# Patient Record
Sex: Male | Born: 1960 | Race: White | Hispanic: No | Marital: Married | Smoking: Former smoker
Health system: Southern US, Community
[De-identification: ages and names within clinical notes are randomized; demographics above are authoritative.]

## PROBLEM LIST (undated history)

## (undated) DIAGNOSIS — J189 Pneumonia, unspecified organism: Secondary | ICD-10-CM

## (undated) DIAGNOSIS — I10 Essential (primary) hypertension: Secondary | ICD-10-CM

## (undated) DIAGNOSIS — R55 Syncope and collapse: Secondary | ICD-10-CM

## (undated) DIAGNOSIS — S82112A Displaced fracture of left tibial spine, initial encounter for closed fracture: Secondary | ICD-10-CM

---

## 2009-05-14 ENCOUNTER — Emergency Department (HOSPITAL_COMMUNITY): Admission: EM | Admit: 2009-05-14 | Discharge: 2009-05-14 | Payer: Self-pay | Admitting: Emergency Medicine

## 2010-07-20 LAB — URINALYSIS, ROUTINE W REFLEX MICROSCOPIC
Bilirubin Urine: NEGATIVE
Hgb urine dipstick: NEGATIVE
Nitrite: NEGATIVE
Protein, ur: NEGATIVE mg/dL
Urobilinogen, UA: 0.2 mg/dL (ref 0.0–1.0)
pH: 6 (ref 5.0–8.0)

## 2010-07-20 LAB — CBC
HCT: 50.3 % (ref 39.0–52.0)
Hemoglobin: 17.4 g/dL — ABNORMAL HIGH (ref 13.0–17.0)
Platelets: 236 10*3/uL (ref 150–400)
RBC: 6.01 MIL/uL — ABNORMAL HIGH (ref 4.22–5.81)
RDW: 13.5 % (ref 11.5–15.5)

## 2010-07-20 LAB — BASIC METABOLIC PANEL
Chloride: 104 mEq/L (ref 96–112)
GFR calc non Af Amer: >60 mL/min (ref >60)
Sodium: 140 mEq/L (ref 135–145)

## 2010-07-20 LAB — DIFFERENTIAL
Basophils Absolute: 0.1 10*3/uL (ref 0.0–0.1)
Eosinophils Absolute: 0.1 10*3/uL (ref 0.0–0.7)
Lymphocytes Relative: 36 % (ref 12–46)
Lymphs Abs: 4.2 10*3/uL — ABNORMAL HIGH (ref 0.7–4.0)

## 2012-02-03 ENCOUNTER — Inpatient Hospital Stay (HOSPITAL_COMMUNITY): Payer: Self-pay

## 2012-02-03 ENCOUNTER — Encounter (HOSPITAL_COMMUNITY): Payer: Self-pay | Admitting: Emergency Medicine

## 2012-02-03 ENCOUNTER — Inpatient Hospital Stay (HOSPITAL_COMMUNITY)
Admission: EM | Admit: 2012-02-03 | Discharge: 2012-02-05 | DRG: 641 | Disposition: A | Discharge status: Home / Self Care | Payer: MEDICAID | Attending: Internal Medicine | Admitting: Internal Medicine

## 2012-02-03 DIAGNOSIS — I10 Essential (primary) hypertension: Secondary | ICD-10-CM | POA: Diagnosis present

## 2012-02-03 DIAGNOSIS — E860 Dehydration: Principal | ICD-10-CM | POA: Diagnosis present

## 2012-02-03 DIAGNOSIS — Y92009 Unspecified place in unspecified non-institutional (private) residence as the place of occurrence of the external cause: Secondary | ICD-10-CM

## 2012-02-03 DIAGNOSIS — R55 Syncope and collapse: Secondary | ICD-10-CM

## 2012-02-03 DIAGNOSIS — Z87891 Personal history of nicotine dependence: Secondary | ICD-10-CM

## 2012-02-03 DIAGNOSIS — N179 Acute kidney failure, unspecified: Secondary | ICD-10-CM | POA: Diagnosis present

## 2012-02-03 DIAGNOSIS — Z79899 Other long term (current) drug therapy: Secondary | ICD-10-CM

## 2012-02-03 DIAGNOSIS — N058 Unspecified nephritic syndrome with other morphologic changes: Secondary | ICD-10-CM | POA: Diagnosis present

## 2012-02-03 DIAGNOSIS — N289 Disorder of kidney and ureter, unspecified: Secondary | ICD-10-CM

## 2012-02-03 DIAGNOSIS — T394X5A Adverse effect of antirheumatics, not elsewhere classified, initial encounter: Secondary | ICD-10-CM | POA: Diagnosis present

## 2012-02-03 HISTORY — DX: Essential (primary) hypertension: I10

## 2012-02-03 HISTORY — DX: Syncope and collapse: R55

## 2012-02-03 LAB — RAPID URINE DRUG SCREEN, HOSP PERFORMED
Cocaine: NOT DETECTED
Opiates: NOT DETECTED

## 2012-02-03 LAB — CBC WITH DIFFERENTIAL/PLATELET
Basophils Absolute: 0 10*3/uL (ref 0.0–0.1)
Basophils Relative: 0 % (ref 0–1)
Lymphocytes Relative: 18 % (ref 12–46)
Lymphs Abs: 1.4 10*3/uL (ref 0.7–4.0)
MCV: 86.1 fL (ref 78.0–100.0)
Monocytes Absolute: 0.7 10*3/uL (ref 0.1–1.0)
Monocytes Relative: 9 % (ref 3–12)
Neutrophils Relative %: 72 % (ref 43–77)
WBC: 8.2 10*3/uL (ref 4.0–10.5)

## 2012-02-03 LAB — URINALYSIS, ROUTINE W REFLEX MICROSCOPIC
Bilirubin Urine: NEGATIVE
Ketones, ur: NEGATIVE mg/dL
Protein, ur: NEGATIVE mg/dL
Specific Gravity, Urine: 1.013 (ref 1.005–1.030)
pH: 5.5 (ref 5.0–8.0)

## 2012-02-03 LAB — URINE MICROSCOPIC-ADD ON

## 2012-02-03 LAB — BASIC METABOLIC PANEL: Sodium: 135 mEq/L (ref 135–145)

## 2012-02-03 LAB — CK: Total CK: 467 U/L — ABNORMAL HIGH (ref 7–232)

## 2012-02-03 MED ORDER — ACETAMINOPHEN 325 MG PO TABS: 650.0000 mg | ORAL_TABLET | Freq: Four times a day (QID) | ORAL | Status: DC | PRN

## 2012-02-03 MED ORDER — ASPIRIN EC 81 MG PO TBEC
81.0000 mg | DELAYED_RELEASE_TABLET | Freq: Every day | ORAL | Status: DC
  Administered 2012-02-03 – 2012-02-05 (×3): 81 mg via ORAL
  Filled 2012-02-03 (×3): qty 1

## 2012-02-03 MED ORDER — ACETAMINOPHEN 650 MG RE SUPP: 650.0000 mg | Freq: Four times a day (QID) | RECTAL | Status: DC | PRN

## 2012-02-03 MED ORDER — ENOXAPARIN SODIUM 30 MG/0.3ML ~~LOC~~ SOLN
30.0000 mg | SUBCUTANEOUS | Status: DC
  Administered 2012-02-03: 30 mg via SUBCUTANEOUS
  Filled 2012-02-03 (×2): qty 0.3

## 2012-02-03 MED ORDER — SIMVASTATIN 20 MG PO TABS
20.0000 mg | ORAL_TABLET | Freq: Every day | ORAL | Status: DC
  Administered 2012-02-04: 20 mg via ORAL
  Filled 2012-02-03 (×2): qty 1

## 2012-02-03 MED ORDER — ONDANSETRON HCL 4 MG/2ML IJ SOLN: 4.0000 mg | Freq: Four times a day (QID) | INTRAMUSCULAR | Status: DC | PRN

## 2012-02-03 MED ORDER — SODIUM CHLORIDE 0.9 % IV SOLN
INTRAVENOUS | Status: DC
  Administered 2012-02-03: 22:00:00 via INTRAVENOUS
  Administered 2012-02-04 (×2): 100 mL/h via INTRAVENOUS
  Administered 2012-02-05: 04:00:00 via INTRAVENOUS

## 2012-02-03 MED ORDER — ONDANSETRON HCL 4 MG PO TABS: 4.0000 mg | ORAL_TABLET | Freq: Four times a day (QID) | ORAL | Status: DC | PRN

## 2012-02-03 MED ORDER — SODIUM CHLORIDE 0.9 % IJ SOLN: 3.0000 mL | Freq: Two times a day (BID) | INTRAMUSCULAR | Status: DC

## 2012-02-03 NOTE — ED Provider Notes (Signed)
Medical screening examination/treatment/procedure(s) were performed by non-physician practitioner and as supervising physician I was immediately available for consultation/collaboration.   Gwyneth Sprout, MD 02/03/12 2320

## 2012-02-03 NOTE — ED Notes (Signed)
Per EMS: Pt was outside working for majority of day, had a syncopal episode at [employer]. Pt diaphoretic, N/V. Pt reports taking lisinopril. Pt denies allergies.

## 2012-02-03 NOTE — ED Provider Notes (Signed)
History     CSN: [id number]  Arrival date & time 02/03/12  1655   First MD Initiated Contact with Patient 02/03/12 1702      Chief Complaint  Patient presents with  . Loss of Consciousness    (Consider location/radiation/quality/duration/timing/severity/associated sxs/prior treatment) HPI Emergency department after an episode of syncope. He has been out in the hot sun all day but says he drank [place] water. He denies having any headache or chest pain, weakness, dizziness or any symptoms preceding the syncopal episode. He admits that he has had an episode of this before her but they did not say it was anything seriously give the emergency department to be evaluated for it. His mother and sister are in the room with him and they say that the family has a significant family history for renal failure or requiring transplants. The patient at this time is in no acute distress his vital signs are stable, including orthostatic vital signs.   Past Medical History  Diagnosis Date  . Syncope     History reviewed. No pertinent past surgical history.  No family history on file.  History  Substance Use Topics  . Smoking status: Former Games developer  . Smokeless tobacco: Never Used  . Alcohol Use: No      Review of Systems  Review of Systems  Gen: no weight loss, fevers, chills, night sweats  Eyes: no discharge or drainage, no occular pain or visual changes  Nose: no epistaxis or rhinorrhea  Mouth: no dental pain, no sore throat  Neck: no neck pain  Lungs:No wheezing, coughing or hemoptysis CV: no chest pain, palpitations, dependent edema or orthopnea  Abd: no abdominal pain, nausea, vomiting  GU: no dysuria or gross hematuria  MSK:  No abnormalities  Neuro: no headache, no focal neurologic deficits, + syncopal episode Skin: no abnormalities Psyche: negative.   Allergies  Review of patient's allergies indicates no known allergies.  Home Medications   Current Outpatient Rx    Name Route Sig Dispense Refill  . LISINOPRIL 10 MG PO TABS Oral Take 10 mg by mouth daily.    Marland Kitchen PRAVASTATIN SODIUM 40 MG PO TABS Oral Take 40 mg by mouth at bedtime.    Marland Kitchen RANITIDINE HCL 150 MG PO TABS Oral Take 150 mg by mouth 2 (two) times daily.      BP 104/70  Pulse 79  Temp 97.5 F (36.4 C) (Oral)  Resp 18  Ht 5\' 9"  (1.753 m)  Wt 180 lb (81.647 kg)  BMI 26.58 kg/m2  SpO2 100%  Physical Exam  Nursing note and vitals reviewed. Constitutional: He appears well-developed and well-nourished. No distress.  HENT:  Head: Normocephalic and atraumatic.  Eyes: Pupils are equal, round, and reactive to light.  Neck: Normal range of motion. Neck supple.  Cardiovascular: Normal rate and regular rhythm.   Pulmonary/Chest: Effort normal.  Abdominal: Soft.  Neurological: He is alert.  Skin: Skin is warm and dry.    ED Course  Procedures (including critical care time)  Labs Reviewed  CBC WITH DIFFERENTIAL - Abnormal; Notable for the following:    HCT 38.9 (*)     All other components within normal limits  BASIC METABOLIC PANEL - Abnormal; Notable for the following:    Glucose, Bld 102 (*)     Creatinine, Ser 3.38 (*)     Calcium 10.7 (*)     GFR calc non Af Amer 20 (*)     GFR calc Af Amer 23 (*)  All other components within normal limits  TROPONIN I  URINALYSIS, ROUTINE W REFLEX MICROSCOPIC   No results found.   1. Acute renal insufficiency       MDM  KG is normal, troponin is negative. I do not see an indication for head CT at this time as he as no neurological deficits. The patient's renal function is severely decreased. Normal he has normal renal function creatinine and GFR. Today his creatinine is 3.38 and his GFR is 20. I've discussed this with my attending and tingling to admit the patient for acute onset of renal insufficiency.  [facility] has agreed to come see Pt in the ED for admission.   Date: 02/03/2012  Rate: 80  Rhythm: normal sinus rhythm   QRS Axis: normal  Intervals: normal  ST/T Wave abnormalities: normal  Conduction Disutrbances:none  Narrative Interpretation:   Old EKG Reviewed: unchanged May 14, 2009         Dorthula Matas, [place] 02/03/12 1926

## 2012-02-03 NOTE — Progress Notes (Signed)
Report given to Okey Regal, RN to assume care of this patient.  Earnest Conroy. Clelia Croft, RN

## 2012-02-03 NOTE — ED Notes (Signed)
Patient transported to radiology

## 2012-02-03 NOTE — H&P (Signed)
[facility] History and Physical  JAIMESON GOPAL VWU:[id number] DOB: 11/12/1960 DOA: 02/03/2012   PCP: No primary provider on file.   Chief Complaint: Syncope  HPI:  51 year old male presents with syncopal episode late this afternoon at a restaurant. The patient states that he got up to walk to the bathroom and started feeling dizzy after which the next thing he remembered was sitting up in the restaurant with EMS. 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 He denied any palpitations, chest discomfort, or incontinence. Prior to today, the patient states that he was in his usual state of health without any problems. He states that prior to today, he has not had any chest discomfort, dyspnea on exertion, or decreased exercise tolerance. After he was lucid, the patient states that he had an episode of nausea and vomiting times one. He has not had any abdominal pain, fevers, chills, dysuria or any further vomiting. he denies any headaches, focal extremity weakness, dysarthria.  The patient states that he has had 3 episodes of syncope in the past with the most recent episode approximately 6 months ago. He is only prodromal symptom is some dizziness associated when he has a change in his posture. He denies any alcohol or illegal drug use. He denies any symptoms of nighttime apnea, PND, orthopnea, chest pain, abdominal pain, dysuria. Patient states that he has been compliant with lisinopril, Pravachol, and intact. Orthostatics were performed in the emergency department, and they were negative. He was given 1 L of normal saline in the emergency department. The patient was found to have acute renal failure with a creatinine of 3.38. His only previous creatinine noted was 1.27 in January 2011. The patient denies any new medications.  Assessment/Plan: Syncope -May be due to hypovolemia other other cardiac and neurologic causes cannot be ruled  out at this time -Serial troponins, EKG -Echocardiogram -EEG -CT brain was not done in the emergency department, this will be ordered -Chest x-ray -Urine drug screen -Urinalysis with microscopy Acute kidney injury -Renal ultrasound -IV fluids -Hold lisinopril -Check CPK as patient is on pravastatin, rule out rhabdomyolysis Hypercalcemia -Likely due to the patient's acute renal failure -Continue hydration        Past Medical History  Diagnosis Date  . Syncope   . Hypertension    History reviewed. No pertinent past surgical history. Social History:  reports that he quit smoking about a year ago. His smoking use included Cigarettes. He smoked 2 packs per day. He has never used smokeless tobacco. He reports that he does not drink alcohol or use illicit drugs.  No Known Allergies  No family history on file.  Prior to Admission medications   Medication Sig Start Date End Date Taking? Authorizing Provider  lisinopril (PRINIVIL,ZESTRIL) 10 MG tablet Take 10 mg by mouth daily.   Yes Historical Provider, MD  pravastatin (PRAVACHOL) 40 MG tablet Take 40 mg by mouth at bedtime.   Yes Historical Provider, MD  ranitidine (ZANTAC) 150 MG tablet Take 150 mg by mouth 2 (two) times daily.   Yes Historical Provider, MD    Review of Systems:  Constitutional:  No weight loss, night sweats, Fevers, chills, fatigue.  Head&Eyes: No headache.  No vision loss.  No eye pain or scotoma ENT:  No Difficulty swallowing,Tooth/dental problems,Sore throat,  No ear ache, post nasal drip,  Cardio-vascular:  No chest pain, Orthopnea, PND, swelling in lower extremities,palpitations  GI:  No heartburn,  indigestion, abdominal pain, nausea, vomiting, diarrhea,loss of appetite, hematochezia, melena Resp:  No shortness of breath with exertion or at rest. No excess mucus, no productive cough, No non-productive cough, No coughing up of blood.No change in color of mucus.No wheezing.No chest wall deformity    Skin:  no rash or lesions.  GU:  no dysuria, change in color of urine, no urgency or frequency. No flank pain.  Musculoskeletal:  No joint pain or swelling. No decreased range of motion. No back pain.  Psych:  No change in mood or affect. No depression or anxiety. Neurologic: No headache, no dysesthesia, no focal weakness, no vision loss. No syncope  Physical Exam: Filed Vitals:   02/03/12 2000 02/03/12 2015 02/03/12 2030 02/03/12 2055  BP: 112/78  108/69 121/73  Pulse: 72  76 69  Temp:    98.3 F (36.8 C)  TempSrc:    Oral  Resp: 13 27 16 16   Height:      Weight:      SpO2: 100%  100% 100%   General:  A&O x 3, NAD, nontoxic, pleasant/cooperative Head/Eye: No conjunctival hemorrhage, no icterus, [place]/AT, No nystagmus ENT:  No icterus,  No thrush, poor dentition, no pharyngeal exudate Neck:  No masses, no lymphadenpathy, no bruits CV:  RRR, no rub, no gallop, no S3 Lung:  CTAB, good air movement, no wheeze, no rhonchi Abdomen: soft/NT, +BS, nondistended, no peritoneal signs Ext: No cyanosis, No rashes, No petechiae, No lymphangitis, No edema Neuro: CNII-XII intact, strength 4/5 in bilateral upper and lower extremities, no dysmetria  Labs on Admission:  Basic Metabolic Panel:  Lab 02/03/12 1914  NA 135  K 3.7  CL 97  CO2 24  GLUCOSE 102*  BUN 23  CREATININE 3.38*  CALCIUM 10.7*  MG --  PHOS --   Liver Function Tests: No results found for this basename: AST:5,ALT:5,ALKPHOS:5,BILITOT:5,PROT:5,ALBUMIN:5 in the last 168 hours No results found for this basename: LIPASE:5,AMYLASE:5 in the last 168 hours No results found for this basename: AMMONIA:5 in the last 168 hours CBC:  Lab 02/03/12 1727  WBC 8.2  NEUTROABS 5.9  HGB 13.9  HCT 38.9*  MCV 86.1  PLT 230   Cardiac Enzymes:  Lab 02/03/12 1727  CKTOTAL --  CKMB --  CKMBINDEX --  TROPONINI <0.30   BNP: No components found with this basename: POCBNP:5 CBG: No results found for this basename: GLUCAP:5 in  the last 168 hours  Radiological Exams on Admission: X-ray Chest Pa And Lateral   02/03/2012  *RADIOLOGY REPORT*  Clinical Data: Syncope  CHEST - 2 VIEW  Comparison: 05/14/2009  Findings: Cardiomediastinal silhouette is stable.  No acute infiltrate or pleural effusion.  No pulmonary edema.  There is elevation of the right hemidiaphragm.  Stable central mild bronchitic changes.  IMPRESSION: No acute infiltrate or pulmonary edema.  Stable central mild bronchitic changes.   Original Report Authenticated By: Natasha Mead, M.D.    Ct Head Wo Contrast  02/03/2012  *RADIOLOGY REPORT*  Clinical Data: Syncope  CT HEAD WITHOUT CONTRAST  Technique:  Contiguous axial images were obtained from the base of the skull through the vertex without contrast. Study was obtained within 24 hours of patient arrival at the emergency department.  Comparison: May 14, 2009  Findings: Ventricles are normal in size and configuration.  There is no mass, hemorrhage, extra-axial fluid collection, or midline shift.  Gray-white compartments are normal.  No evidence of acute infarct.  Bony calvarium appears intact.  Mastoids of the right are  clear. There is some mild chronic thickening in several mastoid air cells on the left.  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  Study otherwise within normal limits.   Original Report Authenticated By: Arvin Collard. WOODRUFF III, M.D.     EKG: Independently reviewed. Sinus rhythm, no ST-T wave changes    Time spend:70 minutes Code Status: full Family Communication: mother at bedside   Anush Wiedeman, DO  [facility] Pager [phone]  If 7PM-7AM, please contact night-coverage [email or web address] Password [email or web address] 02/03/2012, 10:35 PM

## 2012-02-04 ENCOUNTER — Inpatient Hospital Stay (HOSPITAL_COMMUNITY): Payer: Self-pay

## 2012-02-04 ENCOUNTER — Inpatient Hospital Stay (HOSPITAL_COMMUNITY)
Admit: 2012-02-04 | Discharge: 2012-02-04 | Disposition: A | Discharge status: Home / Self Care | Payer: 59 | Attending: Internal Medicine | Admitting: Internal Medicine

## 2012-02-04 DIAGNOSIS — N179 Acute kidney failure, unspecified: Secondary | ICD-10-CM

## 2012-02-04 DIAGNOSIS — N289 Disorder of kidney and ureter, unspecified: Secondary | ICD-10-CM

## 2012-02-04 DIAGNOSIS — R55 Syncope and collapse: Secondary | ICD-10-CM

## 2012-02-04 LAB — CBC
HCT: 36 % — ABNORMAL LOW (ref 39.0–52.0)
MCHC: 35 g/dL (ref 30.0–36.0)
MCV: 88.2 fL (ref 78.0–100.0)
RDW: 14.1 % (ref 11.5–15.5)

## 2012-02-04 LAB — BASIC METABOLIC PANEL
BUN: 22 mg/dL (ref 6–23)
CO2: 26 mEq/L (ref 19–32)
Chloride: 100 mEq/L (ref 96–112)
Creatinine, Ser: 2.44 mg/dL — ABNORMAL HIGH (ref 0.50–1.35)
Glucose, Bld: 96 mg/dL (ref 70–99)

## 2012-02-04 MED ORDER — ENOXAPARIN SODIUM 40 MG/0.4ML ~~LOC~~ SOLN
40.0000 mg | SUBCUTANEOUS | Status: DC
  Filled 2012-02-04: qty 0.4

## 2012-02-04 MED ORDER — LABETALOL HCL 100 MG PO TABS: 100.0000 mg | ORAL_TABLET | Freq: Two times a day (BID) | ORAL | Status: DC

## 2012-02-04 MED ORDER — PANTOPRAZOLE SODIUM 40 MG PO TBEC
40.0000 mg | DELAYED_RELEASE_TABLET | Freq: Every day | ORAL | Status: DC
  Administered 2012-02-04 – 2012-02-05 (×2): 40 mg via ORAL
  Filled 2012-02-04 (×2): qty 1

## 2012-02-04 MED ORDER — ENOXAPARIN SODIUM 30 MG/0.3ML ~~LOC~~ SOLN
30.0000 mg | SUBCUTANEOUS | Status: DC
  Administered 2012-02-04: 30 mg via SUBCUTANEOUS
  Filled 2012-02-04 (×2): qty 0.3

## 2012-02-04 NOTE — Discharge Summary (Signed)
Physician Discharge Summary  Stephen Blanchard ZOX:[id number] DOB: [birth year] DOA: 02/03/2012  PCP: No primary provider on file.  Admit date: 02/03/2012 Discharge date: 02/04/2012  Recommendations for Outpatient Follow-up:  1. Pt will need to follow up with PCP in 2-3 weeks post discharge 2. Please obtain BMP to evaluate electrolytes and kidney function 3. Please also check CBC to evaluate Hg and Hct levels 4. Pt insisting on leaving today and I have provided contact information for appropriate follow up 5. Pt educated on current diagnosis and planned work up but wants to have this done in an outpatient setting 6. 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 The patient states that he got up to walk to the bathroom and started feeling dizzy after which the next thing he remembered was sitting up in the restaurant with EMS. 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 He denied any palpitations, chest discomfort, or incontinence. Prior to today, the patient states that he was in his usual state of health without any  problems. He states that prior to today, he has not had any chest discomfort, dyspnea on exertion, or decreased exercise tolerance. After he was lucid, the patient states that he had an episode of nausea and vomiting times one. He has not had any abdominal pain, fevers, chills, dysuria or any further vomiting. he denies any headaches, focal extremity weakness, dysarthria.  Procedures/Studies: X-ray Chest Pa And Lateral   02/03/2012  *RADIOLOGY REPORT*  Clinical Data: Syncope  CHEST - 2 VIEW  Comparison: 05/14/2009  Findings: Cardiomediastinal silhouette is stable.  No acute infiltrate or pleural effusion.  No pulmonary edema.  There is elevation of the right hemidiaphragm.  Stable central mild bronchitic changes.  IMPRESSION: No acute infiltrate or pulmonary edema.  Stable central mild bronchitic changes.   Original Report Authenticated By: Natasha Mead, M.D.    Ct Head Wo Contrast  02/03/2012  *RADIOLOGY REPORT*  Clinical Data: Syncope  CT HEAD WITHOUT CONTRAST  Technique:  Contiguous axial images were obtained from the base of the skull through the vertex without contrast. Study was obtained within 24 hours of patient arrival at the emergency department.  Comparison: May 14, 2009  Findings: Ventricles are normal in size and configuration.  There is no mass, hemorrhage, extra-axial fluid collection, or midline shift.  Gray-white compartments are normal.  No evidence of acute infarct.  Bony calvarium appears intact.  Mastoids of the right are clear. There is some mild chronic thickening in several mastoid air cells on the left.  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  Study  otherwise within normal limits.   Original Report Authenticated By: Arvin Collard. WOODRUFF III, M.D.    US Renal  02/04/2012  *RADIOLOGY REPORT*  Clinical Data: Acute renal failure.  Hypertension.  RENAL/URINARY TRACT ULTRASOUND COMPLETE  Comparison:  None.  Findings:  Right Kidney:  Measures 10.6 cm.  There is cortical  thinning.  No stone, mass or hydronephrosis.  Left Kidney:  Measures 10.8 cm.  Cortex is mildly thinned but not to the degree seen in the right kidney.  No stone, mass or hydronephrosis.  Bladder:  Unremarkable.  IMPRESSION:  1.  Bilateral cortical thinning, worse on the right. 2.  Negative for hydronephrosis or other acute abnormality.   Original Report Authenticated By: Bernadene Bell. Maricela Curet, M.D.     Consultations: None  Antibiotics: None  Discharge Exam: Filed Vitals:   02/04/12 0623  BP: 95/50  Pulse: 64  Temp: 98.5 F (36.9 C)  Resp: 14   Filed Vitals:   02/03/12 2015 02/03/12 2030 02/03/12 2055 02/04/12 0623  BP:  108/69 121/73 95/50  Pulse:  76 69 64  Temp:   98.3 F (36.8 C) 98.5 F (36.9 C)  TempSrc:   Oral Oral  Resp: 27 16 16 14   Height:      Weight:      SpO2:  100% 100% 100%    General: Pt is alert, follows commands appropriately, not in acute distress Cardiovascular: Regular rate and rhythm, S1/S2 +, no murmurs, no rubs, no gallops Respiratory: Clear to auscultation bilaterally, no wheezing, no crackles, no rhonchi Abdominal: Soft, non tender, non distended, bowel sounds +, no guarding Extremities: no edema, no cyanosis, pulses palpable bilaterally DP and PT Neuro: Grossly nonfocal  Discharge Instructions  Discharge Orders    Future Orders Please Complete By Expires   Diet - low sodium heart healthy      Increase activity slowly          Medication List     As of 02/04/2012 11:22 AM    STOP taking these medications         lisinopril 10 MG tablet   Commonly known as: PRINIVIL,ZESTRIL      TAKE these medications         labetalol 100 MG tablet   Commonly known as: NORMODYNE   Take 1 tablet (100 mg total) by mouth 2 (two) times daily.      pravastatin 40 MG tablet   Commonly known as: PRAVACHOL   Take 40 mg by mouth at bedtime.      ranitidine 150 MG tablet   Commonly known as: ZANTAC   Take 150 mg by mouth 2 (two) times daily.            Follow-up Information    Follow up with primary care provider in 2 weeks.      Follow up with Debbora Presto, MD.   Contact information:   [address], [address] [place] [address] call [phone] if you have questions           The results of significant diagnostics from this hospitalization (including imaging, microbiology, ancillary and laboratory) are listed below for reference.     Microbiology: No results found for this or any previous visit (from the past 240 hour(s)).   Labs: Basic Metabolic Panel:  Lab 02/04/12 8295 02/03/12 1727  NA 137 135  K 4.1 3.7  CL 100 97  CO2 26 24  GLUCOSE 96 102*  BUN 22 23  CREATININE 2.44*  3.38*  CALCIUM 9.6 10.7*  MG -- --  PHOS -- --   Liver Function Tests: No results found for this basename: AST:5,ALT:5,ALKPHOS:5,BILITOT:5,PROT:5,ALBUMIN:5 in the last 168 hours No results found for this basename: LIPASE:5,AMYLASE:5 in the last 168 hours No results found for this basename: AMMONIA:5 in the last 168 hours CBC:  Lab 02/04/12 0325 02/03/12 1727  WBC 7.3 8.2  NEUTROABS -- 5.9  HGB 12.6* 13.9  HCT 36.0* 38.9*  MCV 88.2 86.1  PLT 211 230   Cardiac Enzymes:  Lab 02/04/12 0325 02/03/12 2145 02/03/12 1727  CKTOTAL -- 467* --  CKMB -- -- --  CKMBINDEX -- -- --  TROPONINI <0.30 <0.30 <0.30   BNP: BNP (last 3 results) No results found for this basename: PROBNP:3 in the last 8760 hours CBG: No results found for this basename: GLUCAP:5 in the last 168 hours   SIGNED: Time coordinating discharge: Over 30 minutes  Debbora Presto, MD  [facility] 02/04/2012, 11:22 AM Pager [phone]  If 7PM-7AM, please contact night-coverage [email or web address] Password [email or web address]

## 2012-02-04 NOTE — Progress Notes (Signed)
  Echocardiogram 2D Echocardiogram has been performed.  Stephen Blanchard 02/04/2012, 9:54 AM

## 2012-02-04 NOTE — Procedures (Signed)
History: 51 yo M with syncope.   Sedation: None  Background: this recording consists predominantly of intermixed alpha and beta range(not excessive) activity that is very low voltage. The posterior dominant rhythm is poorly defined, but is seen briefly at 8.5 Hz. There was reactivity to stimuli.   Photic stimulation: Physiologic driving is present  EEG Diagnosis:  1) Low voltage EEG  Clinical Interpretation: This borderline EEG is low voltage with reactivity which in a non-comatose patient most likely represents a normal variant. There was no seizure or seizure predisposition recorded on this study.   Ritta Slot, MD [facility] [phone]

## 2012-02-05 DIAGNOSIS — R55 Syncope and collapse: Secondary | ICD-10-CM

## 2012-02-05 DIAGNOSIS — N179 Acute kidney failure, unspecified: Secondary | ICD-10-CM

## 2012-02-05 LAB — CBC
HCT: 34.1 % — ABNORMAL LOW (ref 39.0–52.0)
Hemoglobin: 11.5 g/dL — ABNORMAL LOW (ref 13.0–17.0)
RDW: 13.8 % (ref 11.5–15.5)
WBC: 4.4 10*3/uL (ref 4.0–10.5)

## 2012-02-05 LAB — BASIC METABOLIC PANEL
BUN: 19 mg/dL (ref 6–23)
Chloride: 105 mEq/L (ref 96–112)
GFR calc Af Amer: 56 mL/min — ABNORMAL LOW (ref >90)
Potassium: 4 mEq/L (ref 3.5–5.1)

## 2012-02-05 MED ORDER — ENOXAPARIN SODIUM 40 MG/0.4ML ~~LOC~~ SOLN
40.0000 mg | SUBCUTANEOUS | Status: DC
  Filled 2012-02-05: qty 0.4

## 2012-02-05 NOTE — Progress Notes (Signed)
Talked to patient about follow up medical care; [facility] Dr Arrie Eastern but is thinking about changing physicians. A list of PCP that are accepting new patients, information on the [facility], [facility] and [facility] given to the patient. B Stephen Iodice RN,Bsn,MHA

## 2012-02-05 NOTE — Progress Notes (Signed)
Patient ID: Stephen Blanchard, male   DOB: November 29, 1960, 51 y.o.   MRN: [id number]  [facility] PROGRESS NOTE  Stephen Blanchard:[id number] DOB: [birth year] DOA: 02/04/2012 PCP: No primary provider on file.  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 The patient states that he got up to walk to the bathroom and started feeling dizzy after which the next thing he remembered was sitting up in the restaurant with EMS. 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 He denied any palpitations, chest discomfort, or incontinence. Prior to today, the patient states that he was in his usual state of health without any problems. He states that prior to today, he has not had any chest discomfort, dyspnea on exertion, or decreased exercise tolerance. After he was lucid, the patient states that he had an episode of nausea and vomiting times one. He has not had any abdominal pain, fevers, chills, dysuria or any further vomiting. he denies any headaches, focal extremity weakness, dysarthria.   Procedures/Studies:  X-ray Chest Pa And Lateral  02/03/2012   IMPRESSION:  No acute infiltrate or pulmonary edema. Stable central mild bronchitic changes.   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 Study otherwise within normal limits.   US Renal  02/04/2012   IMPRESSION:  1. Bilateral cortical thinning, worse on the right.  2. Negative for hydronephrosis or other acute abnormality.  Consultations:  None   Antibiotics: None  HPI/Subjective: No events overnight.   Objective: There were no vitals filed for this visit.  Intake/Output Summary (Last 24 hours) at 02/05/12 1613 Last data filed at 02/05/12 0900  Gross per 24 hour  Intake   1960 ml  Output   1625 ml  Net    335 ml    Exam:   General:  Pt is alert, follows commands appropriately, not in acute distress  Cardiovascular: Regular rate and rhythm, S1/S2, no murmurs, no rubs, no gallops  Respiratory: Clear to auscultation bilaterally, no wheezing, no crackles, no rhonchi  Abdomen: Soft, non tender, non distended, bowel sounds present, no guarding  Extremities: No edema, pulses DP and PT palpable bilaterally  Neuro: Grossly nonfocal  Data Reviewed: Basic Metabolic Panel:  Lab 02/05/12 9562 02/04/12 0325 02/03/12 1727  NA 139 137 135  K 4.0 4.1 3.7  CL 105 100 97  CO2 25 26 24   GLUCOSE 93 96 102*  BUN 19 22 23   CREATININE 1.63* 2.44* 3.38*  CALCIUM 8.9 9.6 10.7*  MG -- -- --  PHOS -- -- --   CBC:  Lab 02/05/12 0435 02/04/12 0325 02/03/12 1727  WBC 4.4 7.3 8.2  NEUTROABS -- -- 5.9  HGB 11.5* 12.6* 13.9  HCT 34.1* 36.0* 38.9*  MCV 90.2 88.2 86.1  PLT 190 211 230   Cardiac Enzymes:  Lab 02/04/12 0325 02/03/12 2145 02/03/12 1727  CKTOTAL -- 467* --  CKMB -- -- --  CKMBINDEX -- -- --  TROPONINI <0.30 <0.30 <0.30     Scheduled Meds:   Continuous Infusions:     Debbora Presto, MD  [facility] Pager [phone]  If 7PM-7AM, please contact night-coverage [email or web address] Password [email or web address] 02/05/2012, 4:13 PM   LOS: 0 days

## 2012-05-26 ENCOUNTER — Emergency Department (HOSPITAL_COMMUNITY)
Admission: EM | Admit: 2012-05-26 | Discharge: 2012-05-26 | Disposition: A | Discharge status: Home / Self Care | Payer: Self-pay | Attending: Emergency Medicine | Admitting: Emergency Medicine

## 2012-05-26 ENCOUNTER — Emergency Department (HOSPITAL_COMMUNITY): Payer: Self-pay

## 2012-05-26 ENCOUNTER — Encounter (HOSPITAL_COMMUNITY): Payer: Self-pay | Admitting: Emergency Medicine

## 2012-05-26 DIAGNOSIS — J189 Pneumonia, unspecified organism: Secondary | ICD-10-CM

## 2012-05-26 DIAGNOSIS — Z87891 Personal history of nicotine dependence: Secondary | ICD-10-CM | POA: Insufficient documentation

## 2012-05-26 DIAGNOSIS — R05 Cough: Secondary | ICD-10-CM | POA: Insufficient documentation

## 2012-05-26 DIAGNOSIS — Z79899 Other long term (current) drug therapy: Secondary | ICD-10-CM | POA: Insufficient documentation

## 2012-05-26 DIAGNOSIS — R112 Nausea with vomiting, unspecified: Secondary | ICD-10-CM | POA: Insufficient documentation

## 2012-05-26 DIAGNOSIS — J111 Influenza due to unidentified influenza virus with other respiratory manifestations: Secondary | ICD-10-CM

## 2012-05-26 DIAGNOSIS — R059 Cough, unspecified: Secondary | ICD-10-CM | POA: Insufficient documentation

## 2012-05-26 DIAGNOSIS — J159 Unspecified bacterial pneumonia: Secondary | ICD-10-CM | POA: Insufficient documentation

## 2012-05-26 DIAGNOSIS — I10 Essential (primary) hypertension: Secondary | ICD-10-CM | POA: Insufficient documentation

## 2012-05-26 LAB — BASIC METABOLIC PANEL
BUN: 21 mg/dL (ref 6–23)
GFR calc Af Amer: 58 mL/min — ABNORMAL LOW (ref >90)
GFR calc non Af Amer: 50 mL/min — ABNORMAL LOW (ref >90)
Potassium: 4.1 mEq/L (ref 3.5–5.1)
Sodium: 130 mEq/L — ABNORMAL LOW (ref 135–145)

## 2012-05-26 LAB — CBC WITH DIFFERENTIAL/PLATELET
Basophils Absolute: 0 10*3/uL (ref 0.0–0.1)
Basophils Relative: 0 % (ref 0–1)
Eosinophils Absolute: 0 10*3/uL (ref 0.0–0.7)
MCH: 28.7 pg (ref 26.0–34.0)
MCHC: 33.7 g/dL (ref 30.0–36.0)
Neutrophils Relative %: 83 % — ABNORMAL HIGH (ref 43–77)
Platelets: 125 10*3/uL — ABNORMAL LOW (ref 150–400)
RDW: 13.2 % (ref 11.5–15.5)

## 2012-05-26 MED ORDER — SODIUM CHLORIDE 0.9 % IV BOLUS (SEPSIS): 1000.0000 mL | Freq: Once | INTRAVENOUS | Status: AC

## 2012-05-26 MED ORDER — HYDROCODONE-ACETAMINOPHEN 5-325 MG PO TABS
1.0000 | ORAL_TABLET | Freq: Once | ORAL | Status: AC
  Administered 2012-05-26: 1 via ORAL
  Filled 2012-05-26: qty 1

## 2012-05-26 MED ORDER — AMOXICILLIN 500 MG PO CAPS: 500.0000 mg | ORAL_CAPSULE | Freq: Three times a day (TID) | ORAL | Status: DC

## 2012-05-26 MED ORDER — PROMETHAZINE HCL 25 MG PO TABS: 25.0000 mg | ORAL_TABLET | Freq: Four times a day (QID) | ORAL | Status: DC | PRN

## 2012-05-26 MED ORDER — HYDROCODONE-ACETAMINOPHEN 5-325 MG PO TABS: 1.0000 | ORAL_TABLET | Freq: Four times a day (QID) | ORAL | Status: DC | PRN

## 2012-05-26 MED ORDER — ALBUTEROL SULFATE (5 MG/ML) 0.5% IN NEBU
5.0000 mg | INHALATION_SOLUTION | Freq: Once | RESPIRATORY_TRACT | Status: AC
  Administered 2012-05-26: 5 mg via RESPIRATORY_TRACT
  Filled 2012-05-26: qty 1

## 2012-05-26 MED ORDER — SODIUM CHLORIDE 0.9 % IV SOLN
Freq: Once | INTRAVENOUS | Status: AC
  Administered 2012-05-26: 12:00:00 via INTRAVENOUS

## 2012-05-26 NOTE — ED Provider Notes (Addendum)
History     CSN: [id number]  Arrival date & time 05/26/12  1109   First MD Initiated Contact with Patient 05/26/12 1340      Chief Complaint  Patient presents with  . Influenza    (Consider location/radiation/quality/duration/timing/severity/associated sxs/prior treatment) Patient is a 52 y.o. male presenting with cough. The history is provided by the patient (pt has a cough and nauseau and vomiting for a few days). No language interpreter was used.  Cough This is a new problem. Episode onset: few days. The problem occurs constantly. The problem has not changed since onset.The cough is non-productive. There has been no fever. Pertinent negatives include no chest pain and no headaches. He has tried nothing for the symptoms. The treatment provided mild relief. Risk factors: nothing. He is not a smoker. His past medical history does not include pneumonia.    Past Medical History  Diagnosis Date  . Syncope   . Hypertension     History reviewed. No pertinent past surgical history.  No family history on file.  History  Substance Use Topics  . Smoking status: Former Smoker -- 2.0 packs/day    Types: Cigarettes    Quit date: 02/03/2011  . Smokeless tobacco: Never Used  . Alcohol Use: No      Review of Systems  Constitutional: Negative for fatigue.  HENT: Negative for congestion, sinus pressure and ear discharge.   Eyes: Negative for discharge.  Respiratory: Positive for cough.   Cardiovascular: Negative for chest pain.  Gastrointestinal: Negative for abdominal pain and diarrhea.  Genitourinary: Negative for frequency and hematuria.  Musculoskeletal: Negative for back pain.  Skin: Negative for rash.  Neurological: Negative for seizures and headaches.  Hematological: Negative.   Psychiatric/Behavioral: Negative for hallucinations.    Allergies  Review of patient's allergies indicates no known allergies.  Home Medications   Current Outpatient Rx  Name  Route  Sig   Dispense  Refill  . CARVEDILOL 6.25 MG PO TABS   Oral   Take 6.25 mg by mouth 2 (two) times daily with a meal.         . FENOFIBRATE 145 MG PO TABS   Oral   Take 145 mg by mouth daily.         Marland Kitchen LABETALOL HCL 100 MG PO TABS   Oral   Take 1 tablet (100 mg total) by mouth 2 (two) times daily.   60 tablet   3   . PANTOPRAZOLE SODIUM 40 MG PO TBEC   Oral   Take 40 mg by mouth daily.         Marland Kitchen PRAVASTATIN SODIUM 40 MG PO TABS   Oral   Take 40 mg by mouth at bedtime.           BP 117/68  Pulse 98  Temp 99.8 F (37.7 C)  Resp 20  Ht 5\' 9"  (1.753 m)  Wt 185 lb (83.915 kg)  BMI 27.32 kg/m2  SpO2 95%  Physical Exam  Constitutional: He is oriented to person, place, and time. He appears well-developed.  HENT:  Head: Normocephalic and atraumatic.  Eyes: Conjunctivae normal and EOM are normal. No scleral icterus.  Neck: Neck supple. No thyromegaly present.  Cardiovascular: Normal rate and regular rhythm.  Exam reveals no gallop and no friction rub.   No murmur heard. Pulmonary/Chest: No stridor. He has no wheezes. He has rales. He exhibits no tenderness.  Abdominal: He exhibits no distension. There is no tenderness. There is no rebound.  Musculoskeletal: Normal range of motion. He exhibits no edema.  Lymphadenopathy:    He has no cervical adenopathy.  Neurological: He is oriented to person, place, and time. Coordination normal.  Skin: No rash noted. No erythema.  Psychiatric: He has a normal mood and affect. His behavior is normal.    ED Course  Procedures (including critical care time)  Labs Reviewed  CBC WITH DIFFERENTIAL - Abnormal; Notable for the following:    Platelets 125 (*)     Neutrophils Relative 83 (*)     Lymphocytes Relative 6 (*)     Lymphs Abs 0.3 (*)     All other components within normal limits  BASIC METABOLIC PANEL - Abnormal; Notable for the following:    Sodium 130 (*)     Chloride 92 (*)     Glucose, Bld 123 (*)     Creatinine, Ser  1.57 (*)     GFR calc non Af Amer 50 (*)     GFR calc Af Amer 58 (*)     All other components within normal limits   Dg Chest 2 View  05/26/2012  *RADIOLOGY REPORT*  Clinical Data: Cough, congestion and fever for 4 days.  CHEST - 2 VIEW  Comparison: PA and lateral chest 02/03/2012 and 05/14/2009.  Findings: There is dense airspace disease in the lingula which is new and consistent with pneumonia.  Right lung is clear.  There is cardiomegaly.  No pneumothorax or pleural fluid.  IMPRESSION:  1.  Dense lingular airspace disease consistent with pneumonia. Recommend follow-up plain films to clearing. 2.  Cardiomegaly without edema.   Original Report Authenticated By: Holley Dexter, M.D.      No diagnosis found.    MDM          Oracio Lennert, MD 05/26/12 1548  Lasalle Lennert, MD 06/08/12 [phone]

## 2012-05-26 NOTE — ED Notes (Signed)
Pt with N/V/D/chills/body aches/fever x 4 days, states that PO fluids are good but has no appetite, lives alone and denies any recent sick contacts

## 2012-05-26 NOTE — ED Notes (Signed)
Pt c/o cough/congestion/fever/chills/aches/n/v/d x 4 days.

## 2012-05-26 NOTE — ED Notes (Signed)
Patient transported to X-ray 

## 2012-06-13 ENCOUNTER — Encounter (HOSPITAL_COMMUNITY): Payer: Self-pay | Admitting: *Deleted

## 2012-06-13 ENCOUNTER — Emergency Department (HOSPITAL_COMMUNITY): Payer: Self-pay

## 2012-06-13 ENCOUNTER — Emergency Department (HOSPITAL_COMMUNITY)
Admission: EM | Admit: 2012-06-13 | Discharge: 2012-06-13 | Disposition: A | Discharge status: Home / Self Care | Payer: Self-pay | Attending: Emergency Medicine | Admitting: Emergency Medicine

## 2012-06-13 ENCOUNTER — Other Ambulatory Visit: Payer: Self-pay

## 2012-06-13 DIAGNOSIS — Z8701 Personal history of pneumonia (recurrent): Secondary | ICD-10-CM | POA: Insufficient documentation

## 2012-06-13 DIAGNOSIS — Z87891 Personal history of nicotine dependence: Secondary | ICD-10-CM | POA: Insufficient documentation

## 2012-06-13 DIAGNOSIS — J9801 Acute bronchospasm: Secondary | ICD-10-CM | POA: Insufficient documentation

## 2012-06-13 DIAGNOSIS — R0602 Shortness of breath: Secondary | ICD-10-CM | POA: Insufficient documentation

## 2012-06-13 DIAGNOSIS — R5381 Other malaise: Secondary | ICD-10-CM | POA: Insufficient documentation

## 2012-06-13 DIAGNOSIS — Z79899 Other long term (current) drug therapy: Secondary | ICD-10-CM | POA: Insufficient documentation

## 2012-06-13 DIAGNOSIS — I10 Essential (primary) hypertension: Secondary | ICD-10-CM | POA: Insufficient documentation

## 2012-06-13 DIAGNOSIS — R5383 Other fatigue: Secondary | ICD-10-CM | POA: Insufficient documentation

## 2012-06-13 HISTORY — DX: Pneumonia, unspecified organism: J18.9

## 2012-06-13 LAB — BASIC METABOLIC PANEL
BUN: 8 mg/dL (ref 6–23)
Chloride: 100 mEq/L (ref 96–112)
GFR calc Af Amer: 84 mL/min — ABNORMAL LOW (ref >90)
Potassium: 3.7 mEq/L (ref 3.5–5.1)

## 2012-06-13 LAB — URINALYSIS, ROUTINE W REFLEX MICROSCOPIC
Leukocytes, UA: NEGATIVE
Nitrite: NEGATIVE
Specific Gravity, Urine: 1.02 (ref 1.005–1.030)
pH: 6 (ref 5.0–8.0)

## 2012-06-13 LAB — CBC WITH DIFFERENTIAL/PLATELET
Basophils Absolute: 0 10*3/uL (ref 0.0–0.1)
Basophils Relative: 1 % (ref 0–1)
Hemoglobin: 13.1 g/dL (ref 13.0–17.0)
MCHC: 34.4 g/dL (ref 30.0–36.0)
Monocytes Relative: 9 % (ref 3–12)
Neutro Abs: 3 10*3/uL (ref 1.7–7.7)
Neutrophils Relative %: 56 % (ref 43–77)
WBC: 5.5 10*3/uL (ref 4.0–10.5)

## 2012-06-13 MED ORDER — ALBUTEROL SULFATE HFA 108 (90 BASE) MCG/ACT IN AERS: 2.0000 | INHALATION_SPRAY | RESPIRATORY_TRACT | Status: DC | PRN

## 2012-06-13 MED ORDER — PREDNISONE 20 MG PO TABS: ORAL_TABLET | ORAL | Status: DC

## 2012-06-13 NOTE — ED Notes (Signed)
Feels weak and sob, Dx with pneumonia 1/23,  And took all of antibiotics. Feels no better, "dry cough".

## 2012-06-13 NOTE — ED Provider Notes (Signed)
History     CSN: [id number]  Arrival date & time 06/13/12  1347   First MD Initiated Contact with Patient 06/13/12 1433      Chief Complaint  Patient presents with  . Cough    (Consider location/radiation/quality/duration/timing/severity/associated sxs/prior treatment) HPI Comments: Patient presents to the ER with complaints of persistent shortness of breath, cough and generalized weakness since treatment for pneumonia. Patient reports he was diagnosed with pneumonia on January 23. He took the entire course of antibiotics, but does not feel much better. Cough is dry and nonproductive. He is not expressing any chest pain. No vomiting or diarrhea. He has not had any fevers.  Patient is a 52 y.o. male presenting with cough.  Cough Associated symptoms: no fever     Past Medical History  Diagnosis Date  . Syncope   . Hypertension   . Pneumonia     History reviewed. No pertinent past surgical history.  History reviewed. No pertinent family history.  History  Substance Use Topics  . Smoking status: Former Smoker -- 2.00 packs/day    Types: Cigarettes    Quit date: 02/03/2011  . Smokeless tobacco: Never Used  . Alcohol Use: No      Review of Systems  Constitutional: Positive for fatigue. Negative for fever.  Respiratory: Positive for cough.   All other systems reviewed and are negative.    Allergies  Review of patient's allergies indicates no known allergies.  Home Medications   Current Outpatient Rx  Name  Route  Sig  Dispense  Refill  . amoxicillin (AMOXIL) 500 MG capsule   Oral   Take 1 capsule (500 mg total) by mouth 3 (three) times daily.   21 capsule   0   . carvedilol (COREG) 6.25 MG tablet   Oral   Take 6.25 mg by mouth 2 (two) times daily with a meal.         . fenofibrate (TRICOR) 145 MG tablet   Oral   Take 145 mg by mouth daily.         Marland Kitchen HYDROcodone-acetaminophen (NORCO/VICODIN) 5-325 MG per tablet   Oral   Take 1 tablet by mouth  every 6 (six) hours as needed for pain.   10 tablet   0   . labetalol (NORMODYNE) 100 MG tablet   Oral   Take 1 tablet (100 mg total) by mouth 2 (two) times daily.   60 tablet   3   . pantoprazole (PROTONIX) 40 MG tablet   Oral   Take 40 mg by mouth daily.         . pravastatin (PRAVACHOL) 40 MG tablet   Oral   Take 40 mg by mouth at bedtime.         . promethazine (PHENERGAN) 25 MG tablet   Oral   Take 1 tablet (25 mg total) by mouth every 6 (six) hours as needed for nausea.   20 tablet   0     BP 119/85  Pulse 75  Temp(Src) 97.8 F (36.6 C) (Oral)  Resp 18  Ht 5\' 9"  (1.753 m)  Wt 200 lb (90.719 kg)  BMI 29.52 kg/m2  SpO2 98%  Physical Exam  Constitutional: He is oriented to person, place, and time. He appears well-developed and well-nourished. No distress.  HENT:  Head: Normocephalic and atraumatic.  Right Ear: Hearing normal.  Nose: Nose normal.  Mouth/Throat: Oropharynx is clear and moist and mucous membranes are normal.  Eyes: Conjunctivae and EOM are normal.  Pupils are equal, round, and reactive to light.  Neck: Normal range of motion. Neck supple.  Cardiovascular: Normal rate, regular rhythm, S1 normal and S2 normal.  Exam reveals no gallop and no friction rub.   No murmur heard. Pulmonary/Chest: Effort normal and breath sounds normal. No respiratory distress. He exhibits no tenderness.  Abdominal: Soft. Normal appearance and bowel sounds are normal. There is no hepatosplenomegaly. There is no tenderness. There is no rebound, no guarding, no tenderness at McBurney's point and negative Murphy's sign. No hernia.  Musculoskeletal: Normal range of motion.  Neurological: He is alert and oriented to person, place, and time. He has normal strength. No cranial nerve deficit or sensory deficit. Coordination normal. GCS eye subscore is 4. GCS verbal subscore is 5. GCS motor subscore is 6.  Skin: Skin is warm, dry and intact. No rash noted. No cyanosis.   Psychiatric: He has a normal mood and affect. His speech is normal and behavior is normal. Thought content normal.    ED Course  Procedures (including critical care time)   Date: 06/13/2012  Rate: 72  Rhythm: normal sinus rhythm  QRS Axis: normal  Intervals: normal  ST/T Wave abnormalities: normal  Conduction Disutrbances: none  Narrative Interpretation: unremarkable      Labs Reviewed  CBC WITH DIFFERENTIAL - Abnormal; Notable for the following:    HCT 38.1 (*)    All other components within normal limits  BASIC METABOLIC PANEL - Abnormal; Notable for the following:    Glucose, Bld 120 (*)    GFR calc non Af Amer 72 (*)    GFR calc Af Amer 84 (*)    All other components within normal limits  URINALYSIS, ROUTINE W REFLEX MICROSCOPIC  TROPONIN I   Dg Chest 2 View  06/13/2012  *RADIOLOGY REPORT*  Clinical Data: Continued cough.  Recent pneumonia.  CHEST - 2 VIEW  Comparison: Two-view chest 05/26/2012.  Findings: Heart size is normal.  Previously noted lingular airspace disease has resolved.  Mild chronic interstitial coarsening is present.  The visualized soft tissues and bony thorax are unremarkable.  IMPRESSION:  1.  Interval clearing of lingular airspace disease. 2.  Stable chronic interstitial coarsening.   Original Report Authenticated By: Marin Roberts, M.D.       diagnosis: Shortness of breath status post pneumonia    MDM  Patient comes to the ER for evaluation of persistent cough and shortness of breath. He reports recently being treated for pneumonia. He continues to have a nonproductive cough and feels very weak. He reports taking entire course of antibiotics. Lungs were clear on auscultation. Blood work was performed in all test were normal. He has a normal white blood cell count. Renal function is normal. A chest x-ray was performed and shows clearing of the pneumonia. Patient reassured. Persistent cough and shortness of breath might be some mild  bronchospasm status post pneumonia. He was short course of corticosteroids and bronchodilators, as patient does have a smoking history.        Gilda Crease, MD 06/13/12 [phone]

## 2013-10-26 IMAGING — CR DG CHEST 2V
2 series · 2 of 2 positions shown · non-contrast
Comparison: 05/14/2009

CLINICAL DATA: Syncope

CHEST - 2 VIEW

[w chest pa]
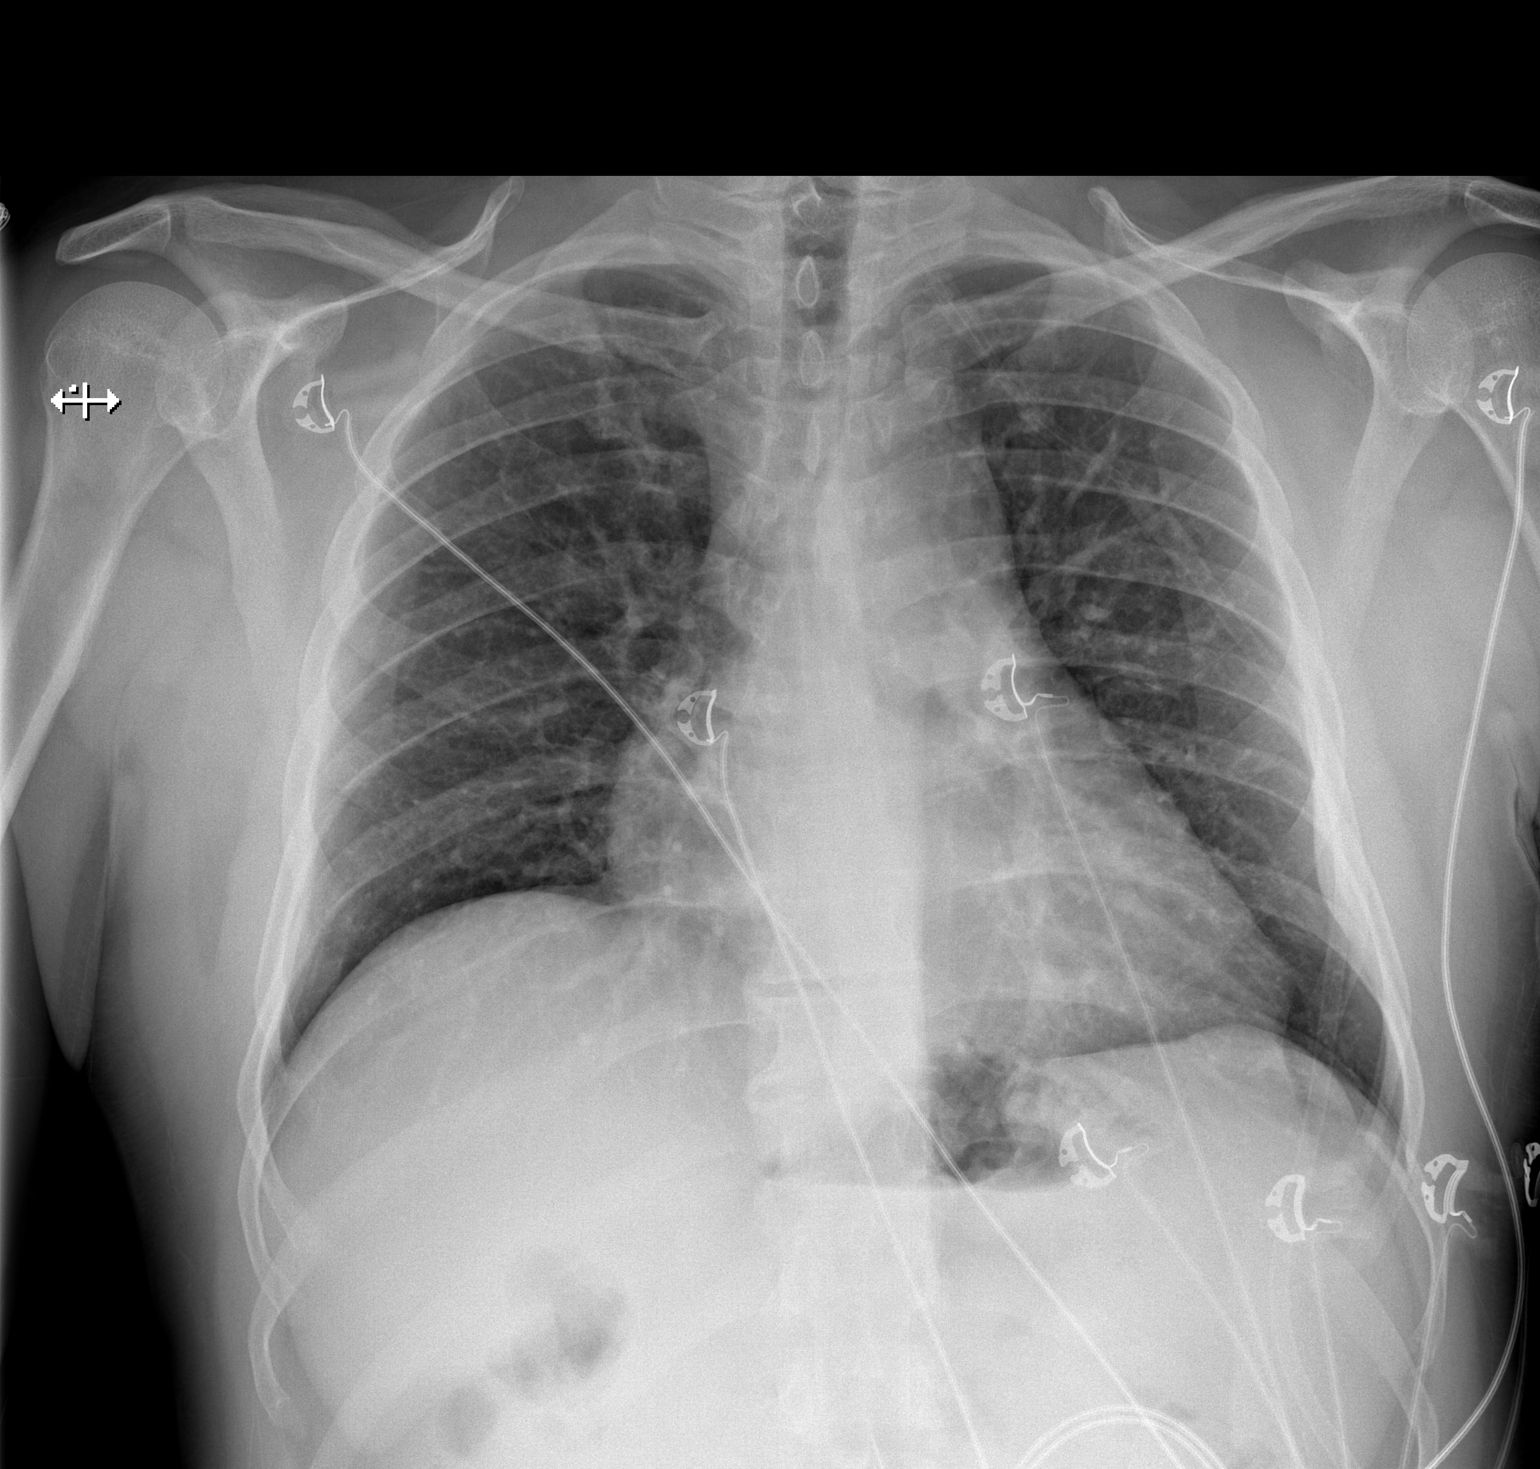

[w chest lat]
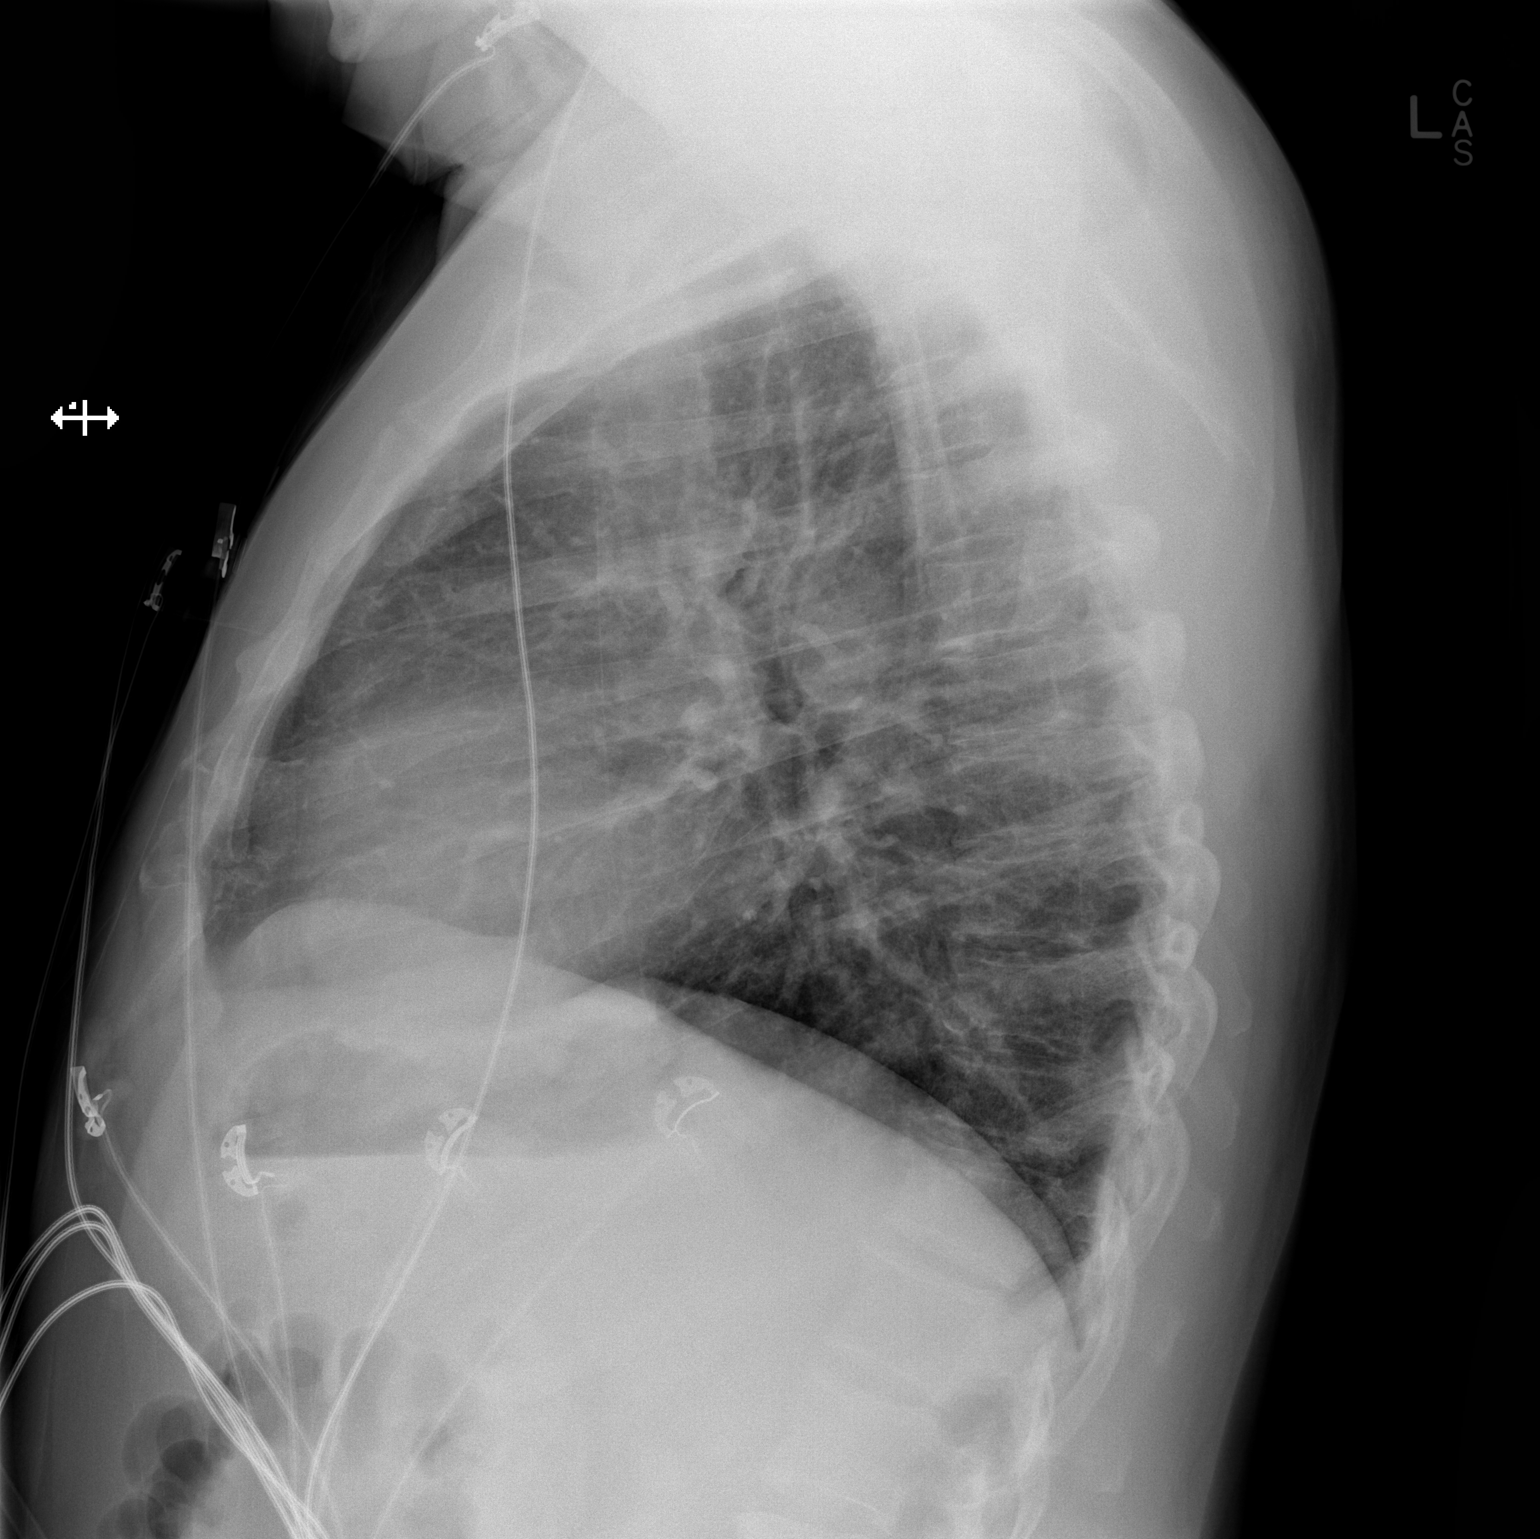

[2 of 2 positions shown; findings below may reference images not displayed]

FINDINGS: Cardiomediastinal silhouette is stable.  No acute
infiltrate or pleural effusion.  No pulmonary edema.  There is
elevation of the right hemidiaphragm.  Stable central mild
bronchitic changes.
IMPRESSION: No acute infiltrate or pulmonary edema.  Stable central mild
bronchitic changes.

## 2013-10-26 IMAGING — CT CT HEAD W/O CM
2 series · 16 of 30 positions shown, 20 images · non-contrast
Comparison: May 14, 2009

CLINICAL DATA: Syncope

CT HEAD WITHOUT CONTRAST
TECHNIQUE: Contiguous axial images were obtained from the base of
the skull through the vertex without contrast. Study was obtained
within 24 hours of patient arrival at the emergency department.

[Series 2: head w/o · axial · non-contrast · 0.43mm/px · z∈[-235,-100]mm · 13 of 33 slices shown, 17 images]
[im 3/33  brain]
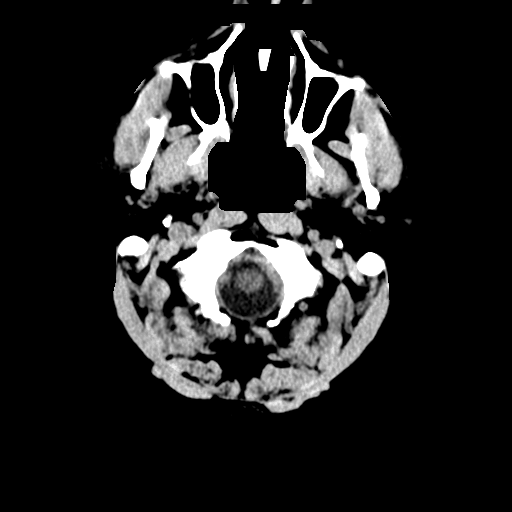
[im 3/33  bone]
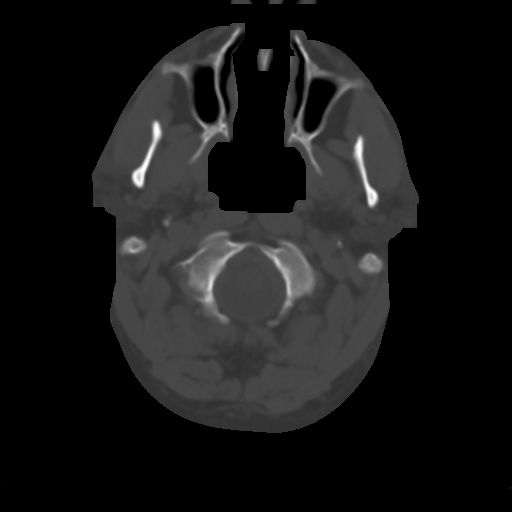
[im 5/33  brain]
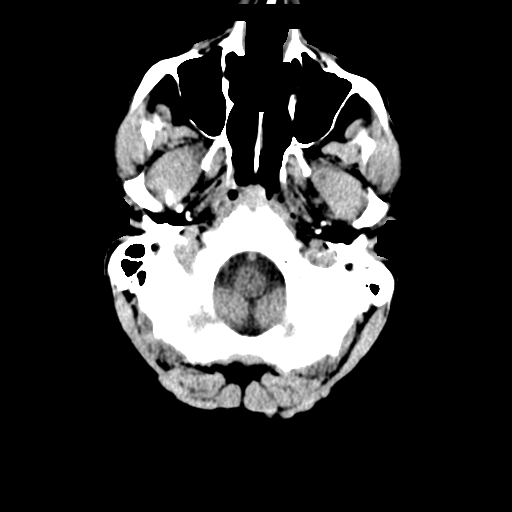
[im 7/33  brain]
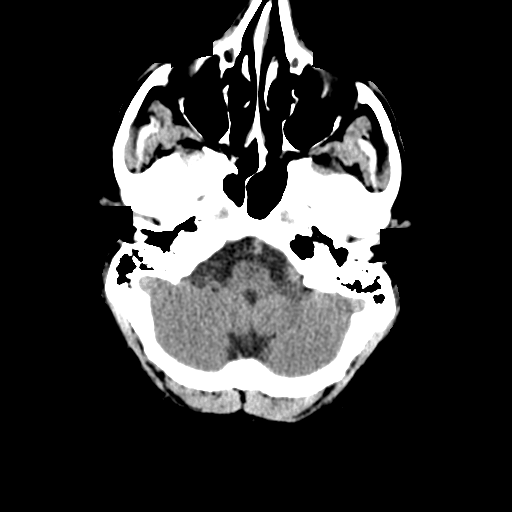
[im 10/33  brain]
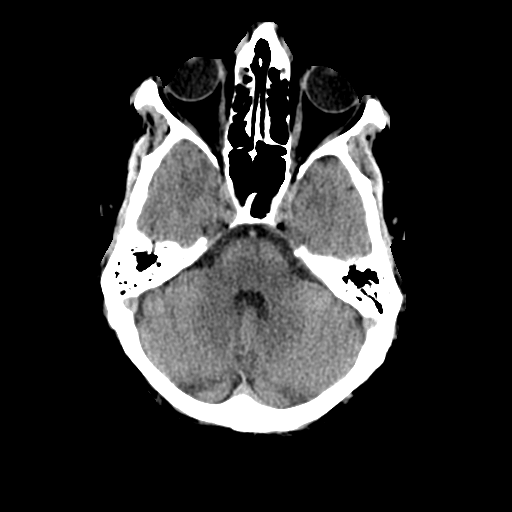
[im 12/33  brain]
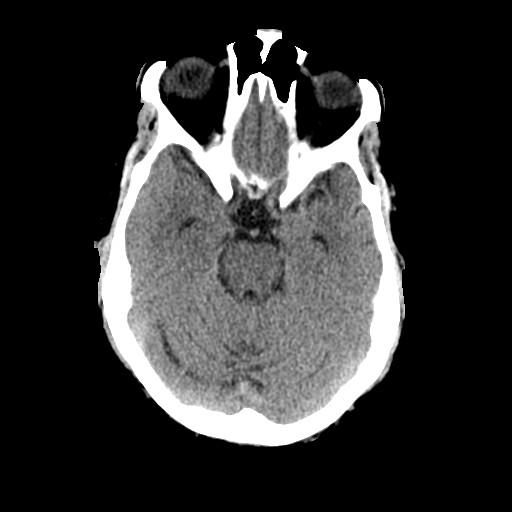
[im 12/33  bone]
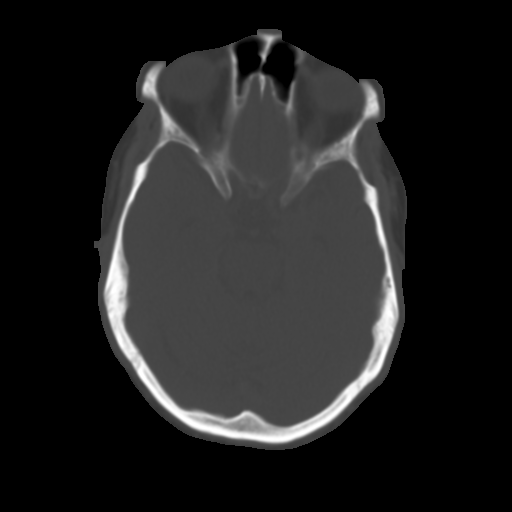
[im 14/33  brain]
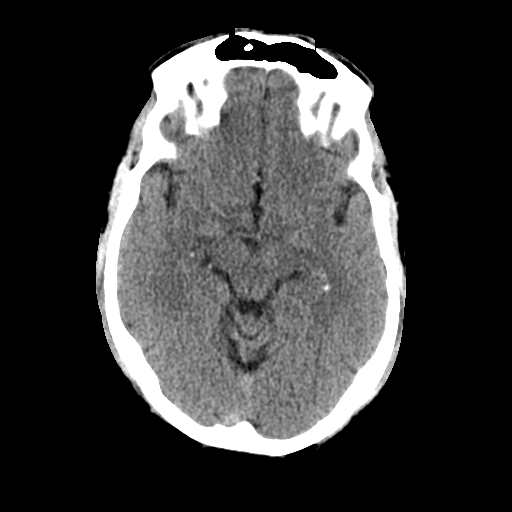
[im 17/33  brain]
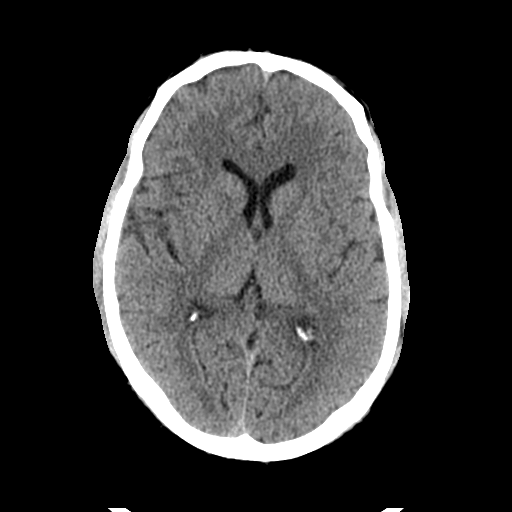
[im 19/33  brain]
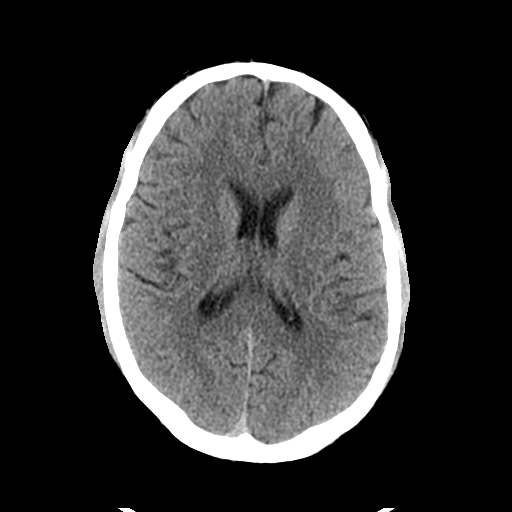
[im 21/33  brain]
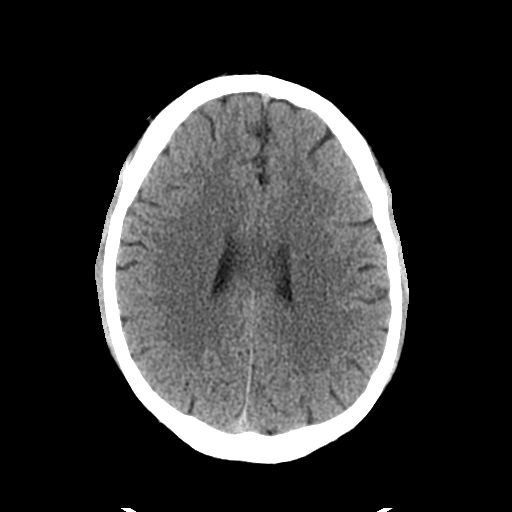
[im 21/33  bone]
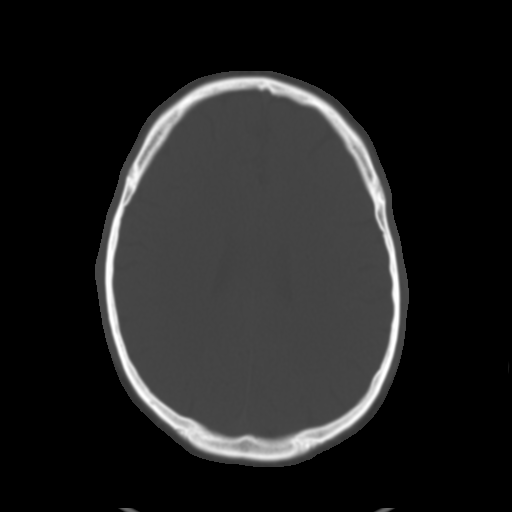
[im 23/33  brain]
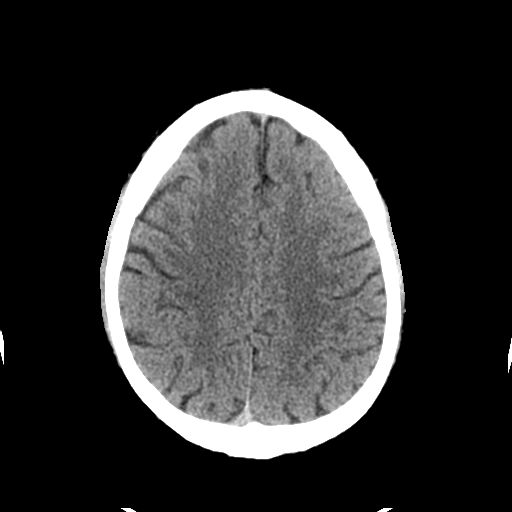
[im 26/33  brain]
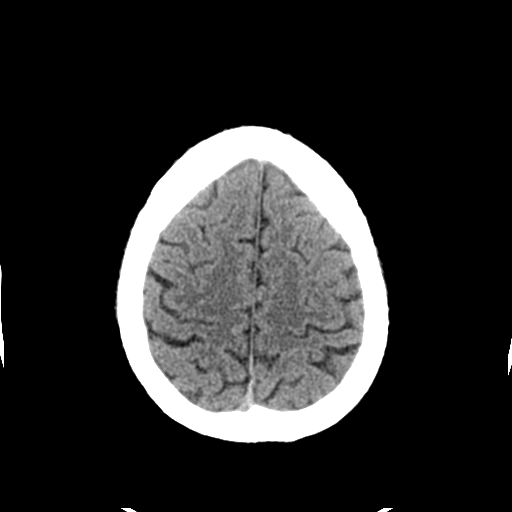
[im 28/33  brain]
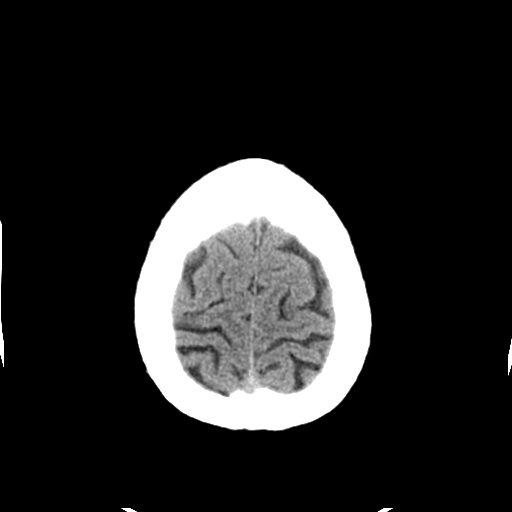
[im 30/33  brain]
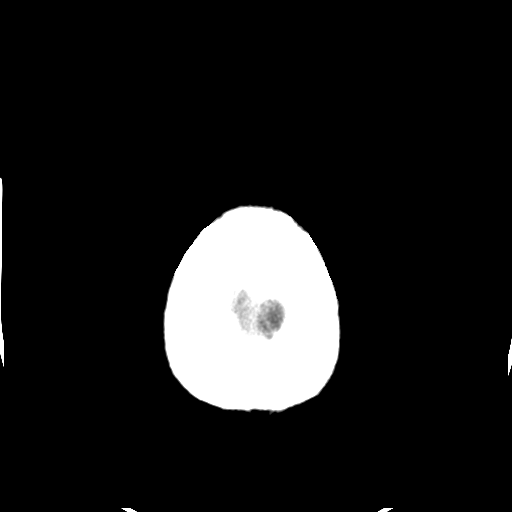
[im 30/33  bone]
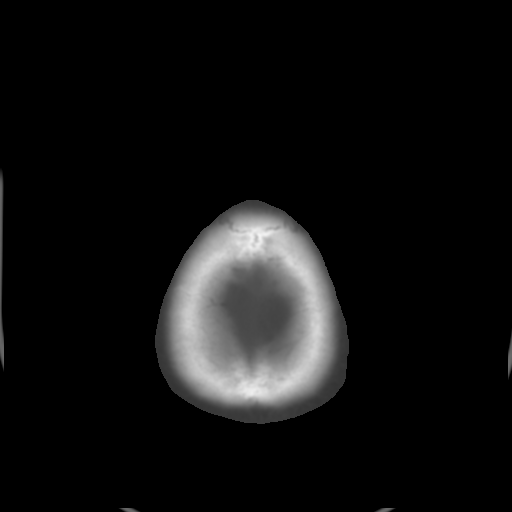

[Series 3: bone windows · axial · 0.43mm/px · z∈[-235,-190]mm · 3 of 33 slices shown]
[im 3/33  bone]
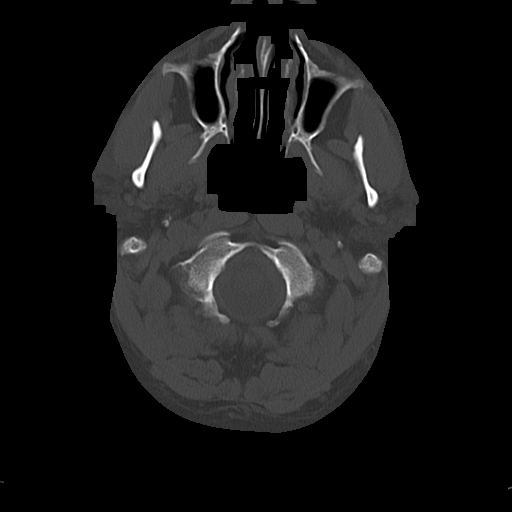
[im 7/33  bone]
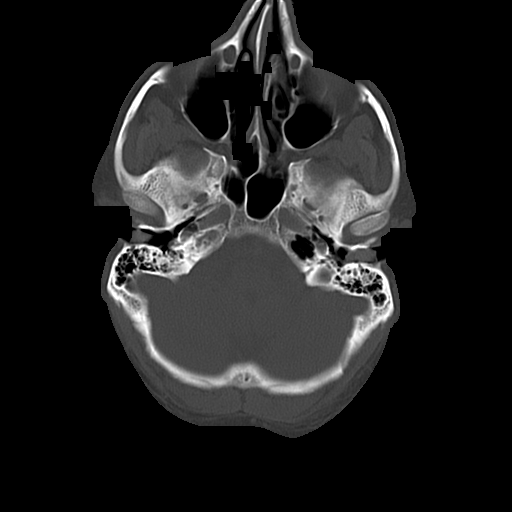
[im 12/33  bone]
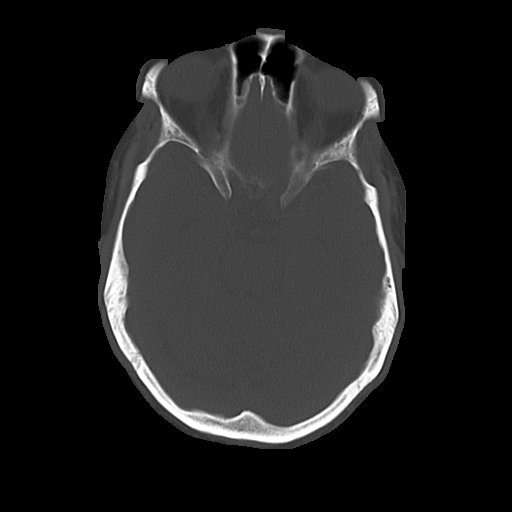

[16 of 30 positions shown; findings below may reference images not displayed]

FINDINGS: Ventricles are normal in size and configuration.  There
is no mass, hemorrhage, extra-axial fluid collection, or midline
shift.  Gray-white compartments are normal.  No evidence of acute
infarct.

Bony calvarium appears intact.  Mastoids of the right are clear.
There is some mild chronic thickening in several mastoid air cells
on the left.
IMPRESSION: Mild chronic mastoid disease on the left.  Study otherwise within
normal limits.

## 2013-10-27 IMAGING — US US RENAL
1 series · 14 of 21 positions shown · non-contrast
Comparison: None.

CLINICAL DATA: Acute renal failure.  Hypertension.

RENAL/URINARY TRACT ULTRASOUND COMPLETE

[Series 1: us renal · 0.32mm/px · 14 of 21 slices shown]
[im 1/21]
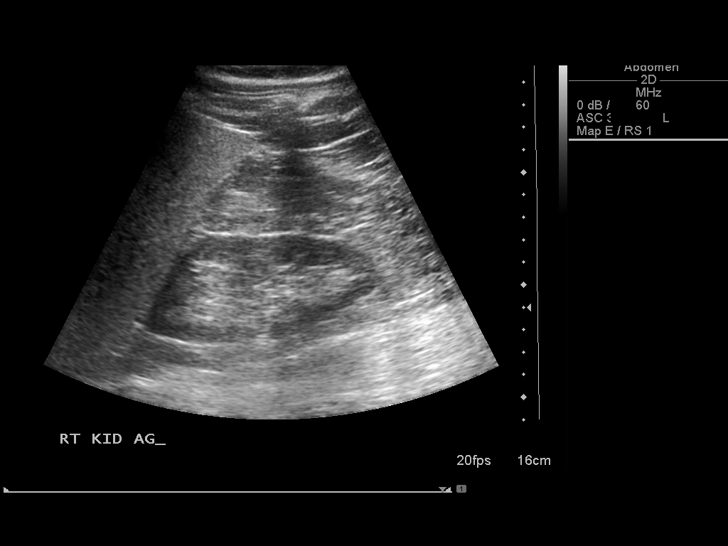
[im 3/21]
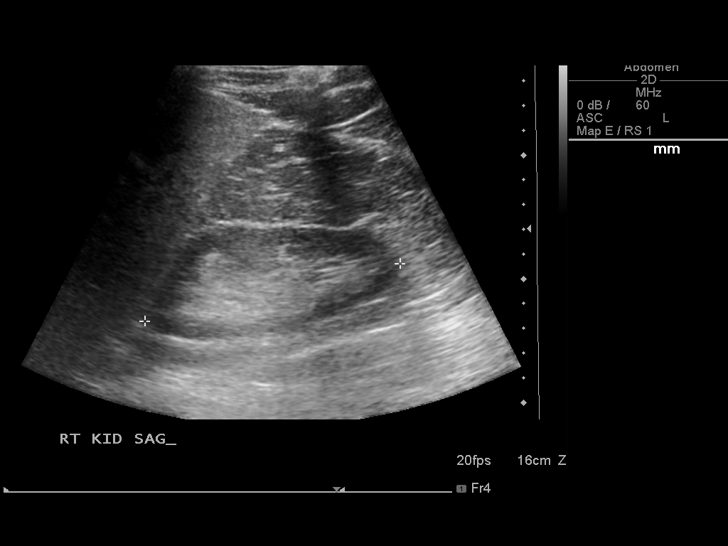
[im 4/21]
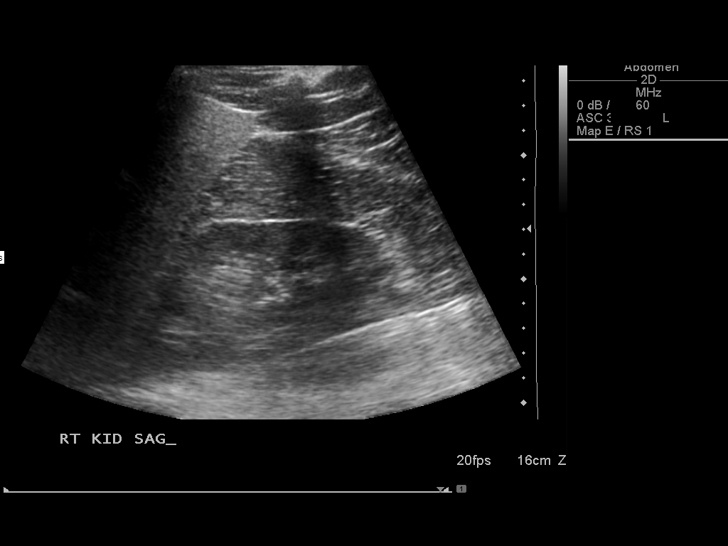
[im 6/21]
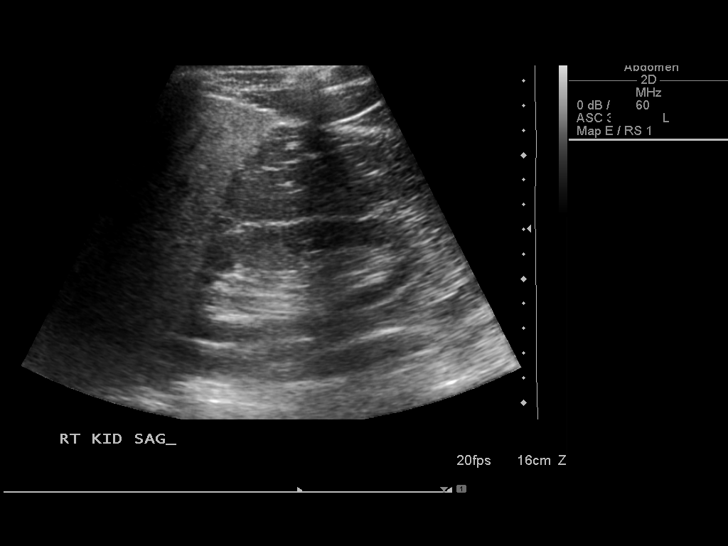
[im 7/21]
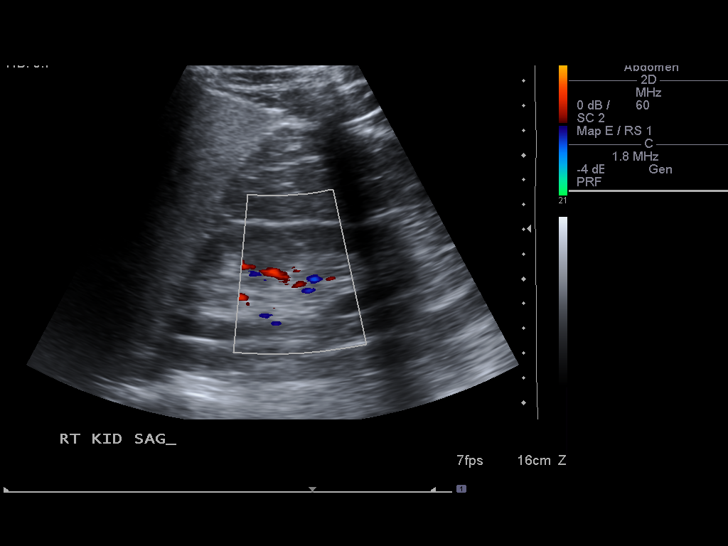
[im 9/21]
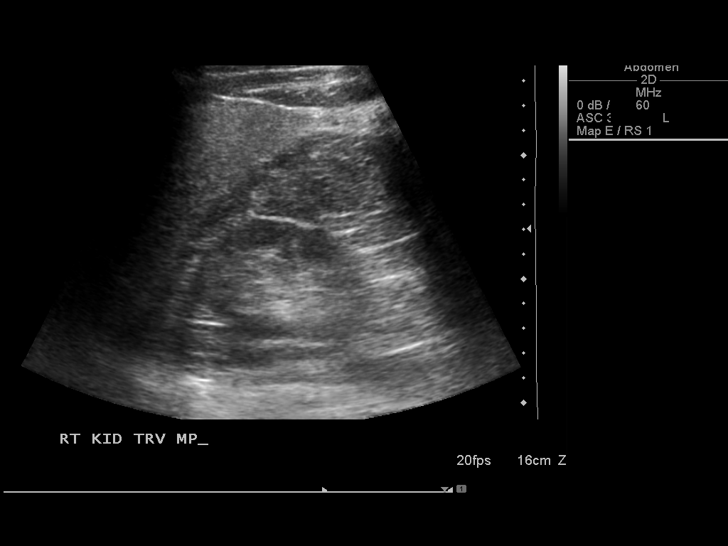
[im 10/21]
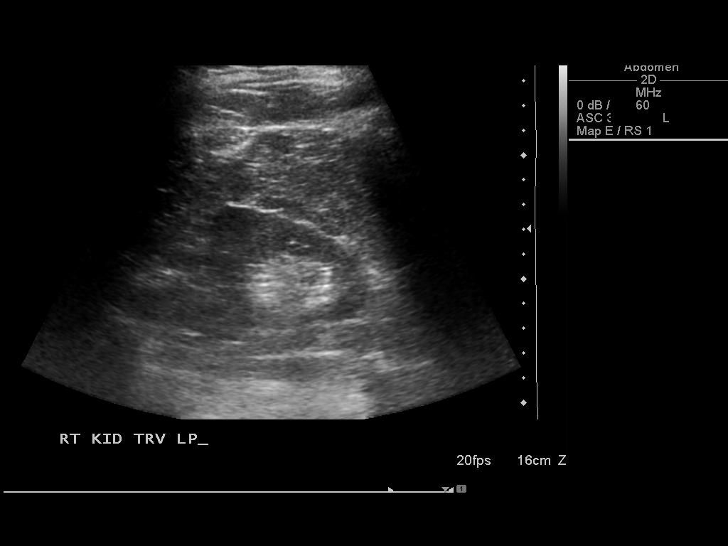
[im 12/21]
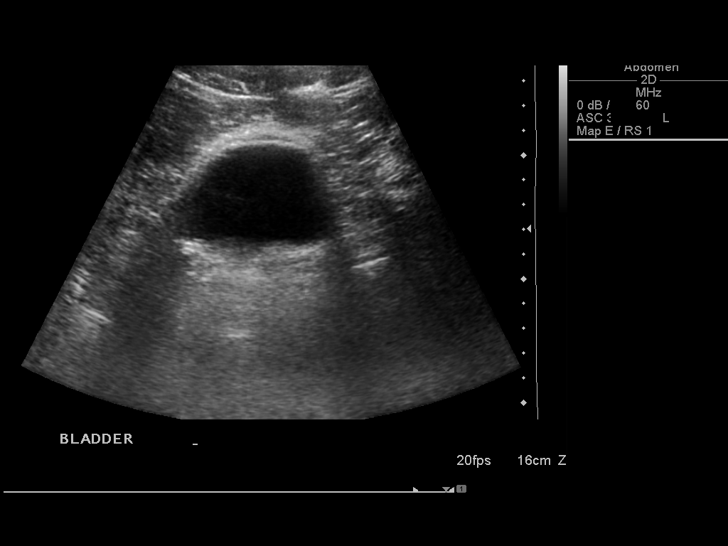
[im 13/21]
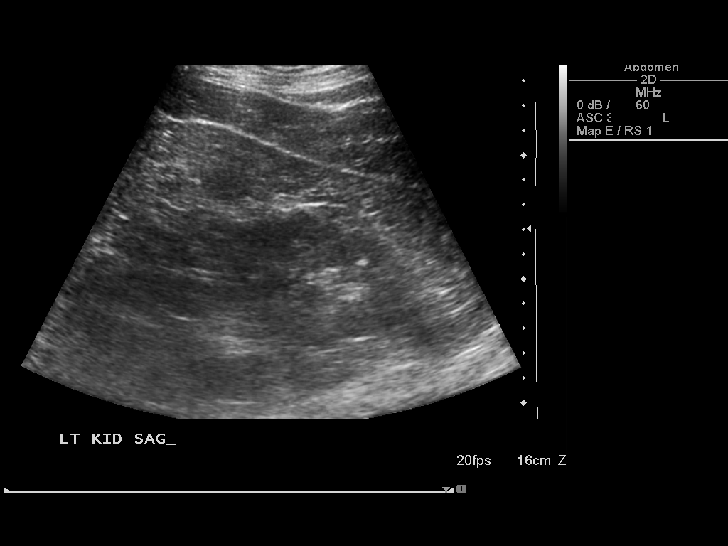
[im 15/21]
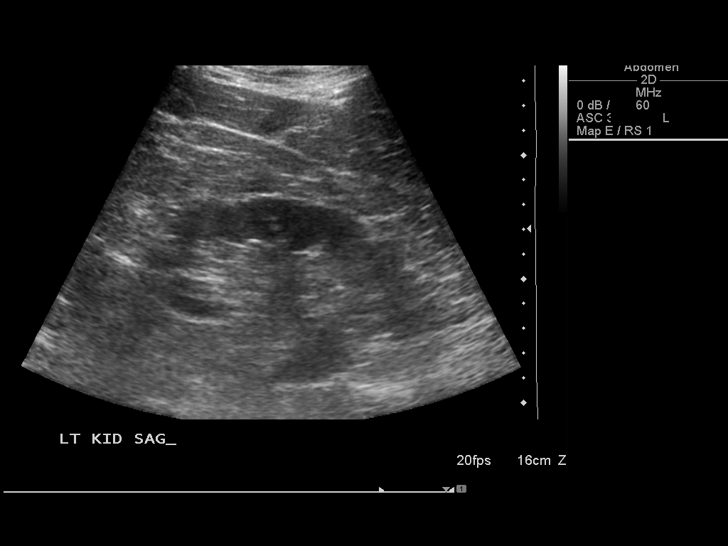
[im 16/21]
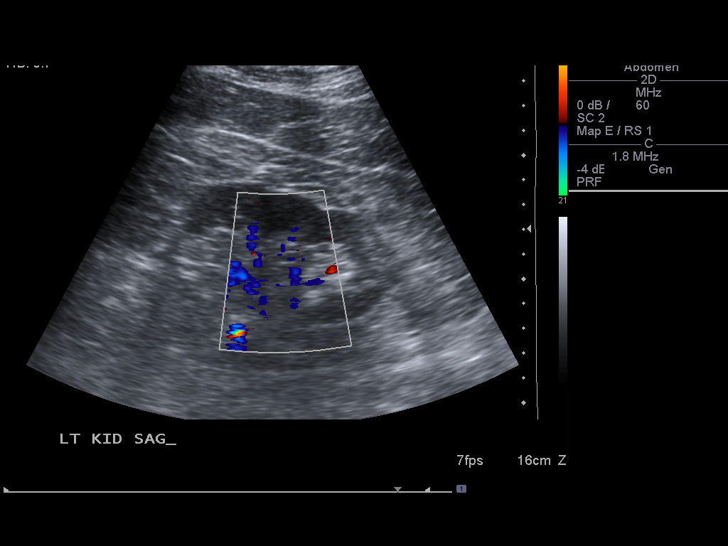
[im 18/21]
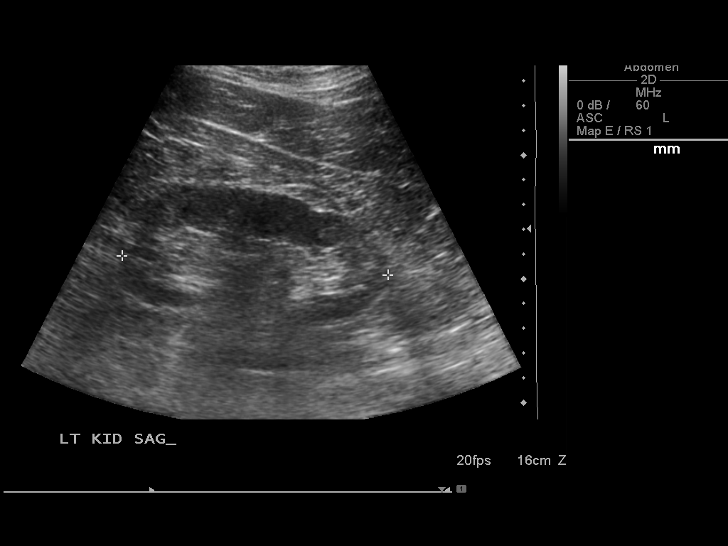
[im 19/21]
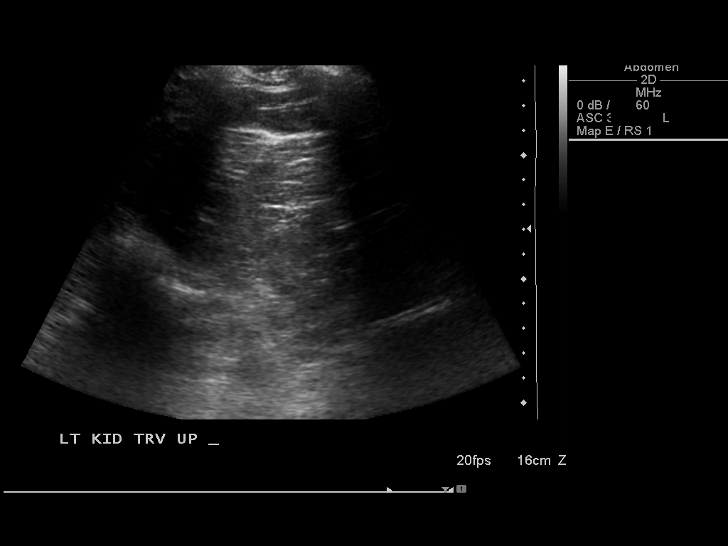
[im 21/21]
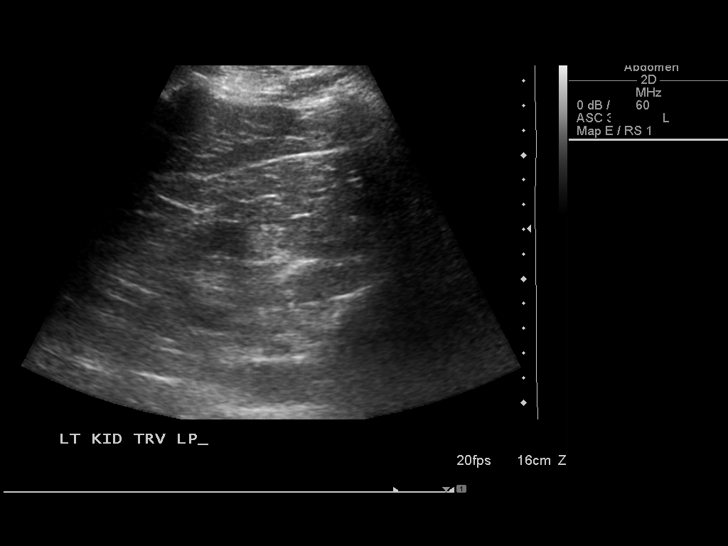

[14 of 21 positions shown; findings below may reference images not displayed]

FINDINGS: Right Kidney:  Measures 10.6 cm.  There is cortical thinning.  No
stone, mass or hydronephrosis.

Left Kidney:  Measures 10.8 cm.  Cortex is mildly thinned but not
to the degree seen in the right kidney.  No stone, mass or
hydronephrosis.

Bladder:  Unremarkable.
IMPRESSION: 1.  Bilateral cortical thinning, worse on the right.
2.  Negative for hydronephrosis or other acute abnormality.

## 2015-10-22 ENCOUNTER — Emergency Department (HOSPITAL_COMMUNITY): Payer: BLUE CROSS/BLUE SHIELD

## 2015-10-22 ENCOUNTER — Other Ambulatory Visit: Payer: Self-pay

## 2015-10-22 ENCOUNTER — Inpatient Hospital Stay (HOSPITAL_COMMUNITY)
Admission: EM | Admit: 2015-10-22 | Discharge: 2015-10-23 | DRG: 194 | Disposition: A | Discharge status: Home / Self Care | Payer: BLUE CROSS/BLUE SHIELD | Attending: Internal Medicine | Admitting: Internal Medicine

## 2015-10-22 ENCOUNTER — Encounter (HOSPITAL_COMMUNITY): Payer: Self-pay | Admitting: *Deleted

## 2015-10-22 DIAGNOSIS — I248 Other forms of acute ischemic heart disease: Secondary | ICD-10-CM | POA: Diagnosis present

## 2015-10-22 DIAGNOSIS — I10 Essential (primary) hypertension: Secondary | ICD-10-CM | POA: Diagnosis present

## 2015-10-22 DIAGNOSIS — J189 Pneumonia, unspecified organism: Principal | ICD-10-CM | POA: Diagnosis present

## 2015-10-22 DIAGNOSIS — R7989 Other specified abnormal findings of blood chemistry: Secondary | ICD-10-CM | POA: Diagnosis not present

## 2015-10-22 DIAGNOSIS — D696 Thrombocytopenia, unspecified: Secondary | ICD-10-CM | POA: Diagnosis present

## 2015-10-22 DIAGNOSIS — R509 Fever, unspecified: Secondary | ICD-10-CM | POA: Diagnosis present

## 2015-10-22 DIAGNOSIS — R778 Other specified abnormalities of plasma proteins: Secondary | ICD-10-CM

## 2015-10-22 DIAGNOSIS — Z87891 Personal history of nicotine dependence: Secondary | ICD-10-CM | POA: Diagnosis not present

## 2015-10-22 LAB — BASIC METABOLIC PANEL
Anion gap: 6 (ref 5–15)
BUN: 13 mg/dL (ref 6–20)
CALCIUM: 9 mg/dL (ref 8.9–10.3)
CHLORIDE: 97 mmol/L — AB (ref 101–111)
CO2: 29 mmol/L (ref 22–32)
CREATININE: 1.22 mg/dL (ref 0.61–1.24)
GFR calc Af Amer: >60 mL/min (ref >60)
GFR calc non Af Amer: >60 mL/min (ref >60)
GLUCOSE: 88 mg/dL (ref 65–99)
Potassium: 4.3 mmol/L (ref 3.5–5.1)
Sodium: 132 mmol/L — ABNORMAL LOW (ref 135–145)

## 2015-10-22 LAB — URINE MICROSCOPIC-ADD ON

## 2015-10-22 LAB — CBC
HCT: 42.2 % (ref 39.0–52.0)
Hemoglobin: 14.3 g/dL (ref 13.0–17.0)
MCH: 28.9 pg (ref 26.0–34.0)
MCHC: 33.9 g/dL (ref 30.0–36.0)
MCV: 85.4 fL (ref 78.0–100.0)
PLATELETS: 141 10*3/uL — AB (ref 150–400)
RBC: 4.94 MIL/uL (ref 4.22–5.81)
RDW: 13.7 % (ref 11.5–15.5)
WBC: 10.6 10*3/uL — ABNORMAL HIGH (ref 4.0–10.5)

## 2015-10-22 LAB — URINALYSIS, ROUTINE W REFLEX MICROSCOPIC
BILIRUBIN URINE: NEGATIVE
GLUCOSE, UA: NEGATIVE mg/dL
Ketones, ur: NEGATIVE mg/dL
Leukocytes, UA: NEGATIVE
Nitrite: NEGATIVE
PROTEIN: NEGATIVE mg/dL
SPECIFIC GRAVITY, URINE: 1.01 (ref 1.005–1.030)
pH: 6.5 (ref 5.0–8.0)

## 2015-10-22 LAB — TROPONIN I
Troponin I: 0.04 ng/mL — ABNORMAL HIGH (ref <0.031)
Troponin I: <0.03 ng/mL (ref <0.031)

## 2015-10-22 LAB — RAPID STREP SCREEN (MED CTR MEBANE ONLY): STREPTOCOCCUS, GROUP A SCREEN (DIRECT): NEGATIVE

## 2015-10-22 MED ORDER — SODIUM CHLORIDE 0.9 % IV SOLN: 250.0000 mL | INTRAVENOUS | Status: DC | PRN

## 2015-10-22 MED ORDER — ACETAMINOPHEN 325 MG PO TABS: 650.0000 mg | ORAL_TABLET | Freq: Four times a day (QID) | ORAL | Status: DC | PRN

## 2015-10-22 MED ORDER — CARVEDILOL 3.125 MG PO TABS
6.2500 mg | ORAL_TABLET | Freq: Two times a day (BID) | ORAL | Status: DC
  Administered 2015-10-23: 6.25 mg via ORAL
  Filled 2015-10-22: qty 2

## 2015-10-22 MED ORDER — ONDANSETRON HCL 4 MG/2ML IJ SOLN: 4.0000 mg | Freq: Four times a day (QID) | INTRAMUSCULAR | Status: DC | PRN

## 2015-10-22 MED ORDER — SODIUM CHLORIDE 0.9 % IV SOLN
INTRAVENOUS | Status: AC
  Administered 2015-10-22: 19:00:00 via INTRAVENOUS

## 2015-10-22 MED ORDER — ASPIRIN 81 MG PO CHEW
324.0000 mg | CHEWABLE_TABLET | Freq: Once | ORAL | Status: AC
  Administered 2015-10-22: 324 mg via ORAL
  Filled 2015-10-22: qty 4

## 2015-10-22 MED ORDER — HEPARIN SODIUM (PORCINE) 5000 UNIT/ML IJ SOLN
5000.0000 [IU] | Freq: Three times a day (TID) | INTRAMUSCULAR | Status: DC
  Administered 2015-10-22 – 2015-10-23 (×2): 5000 [IU] via SUBCUTANEOUS
  Filled 2015-10-22 (×2): qty 1

## 2015-10-22 MED ORDER — DEXTROSE 5 % IV SOLN
INTRAVENOUS | Status: AC
  Filled 2015-10-22: qty 10

## 2015-10-22 MED ORDER — DEXTROSE 5 % IV SOLN
500.0000 mg | Freq: Once | INTRAVENOUS | Status: AC
  Administered 2015-10-22: 500 mg via INTRAVENOUS
  Filled 2015-10-22: qty 500

## 2015-10-22 MED ORDER — ACETAMINOPHEN 650 MG RE SUPP: 650.0000 mg | Freq: Four times a day (QID) | RECTAL | Status: DC | PRN

## 2015-10-22 MED ORDER — ALBUTEROL SULFATE (2.5 MG/3ML) 0.083% IN NEBU: 2.5000 mg | INHALATION_SOLUTION | RESPIRATORY_TRACT | Status: DC | PRN

## 2015-10-22 MED ORDER — TRAZODONE HCL 50 MG PO TABS: 50.0000 mg | ORAL_TABLET | Freq: Every evening | ORAL | Status: DC | PRN

## 2015-10-22 MED ORDER — SODIUM CHLORIDE 0.9 % IV BOLUS (SEPSIS)
1000.0000 mL | Freq: Once | INTRAVENOUS | Status: AC
  Administered 2015-10-22: 1000 mL via INTRAVENOUS

## 2015-10-22 MED ORDER — ONDANSETRON HCL 4 MG PO TABS: 4.0000 mg | ORAL_TABLET | Freq: Four times a day (QID) | ORAL | Status: DC | PRN

## 2015-10-22 MED ORDER — POLYETHYLENE GLYCOL 3350 17 G PO PACK: 17.0000 g | PACK | Freq: Every day | ORAL | Status: DC | PRN

## 2015-10-22 MED ORDER — GUAIFENESIN 100 MG/5ML PO SOLN: 10.0000 mL | ORAL | Status: DC | PRN

## 2015-10-22 MED ORDER — SENNA 8.6 MG PO TABS
1.0000 | ORAL_TABLET | Freq: Two times a day (BID) | ORAL | Status: DC
  Administered 2015-10-22 – 2015-10-23 (×2): 8.6 mg via ORAL
  Filled 2015-10-22 (×2): qty 1

## 2015-10-22 MED ORDER — AZITHROMYCIN 500 MG IV SOLR
500.0000 mg | INTRAVENOUS | Status: DC
  Filled 2015-10-22: qty 500

## 2015-10-22 MED ORDER — DEXTROSE 5 % IV SOLN
1.0000 g | Freq: Once | INTRAVENOUS | Status: AC
  Administered 2015-10-22: 1 g via INTRAVENOUS
  Filled 2015-10-22: qty 10

## 2015-10-22 MED ORDER — PANTOPRAZOLE SODIUM 40 MG PO TBEC
40.0000 mg | DELAYED_RELEASE_TABLET | Freq: Every day | ORAL | Status: DC
  Administered 2015-10-23: 40 mg via ORAL
  Filled 2015-10-22: qty 1

## 2015-10-22 MED ORDER — SODIUM CHLORIDE 0.9% FLUSH: 3.0000 mL | INTRAVENOUS | Status: DC | PRN

## 2015-10-22 MED ORDER — GUAIFENESIN ER 600 MG PO TB12
600.0000 mg | ORAL_TABLET | Freq: Two times a day (BID) | ORAL | Status: DC
  Administered 2015-10-22 – 2015-10-23 (×2): 600 mg via ORAL
  Filled 2015-10-22 (×2): qty 1

## 2015-10-22 MED ORDER — ASPIRIN 81 MG PO CHEW
81.0000 mg | CHEWABLE_TABLET | Freq: Every day | ORAL | Status: DC
  Administered 2015-10-22 – 2015-10-23 (×2): 81 mg via ORAL
  Filled 2015-10-22 (×2): qty 1

## 2015-10-22 MED ORDER — DEXTROSE 5 % IV SOLN
INTRAVENOUS | Status: AC
  Filled 2015-10-22: qty 500

## 2015-10-22 MED ORDER — SODIUM CHLORIDE 0.9% FLUSH: 3.0000 mL | Freq: Two times a day (BID) | INTRAVENOUS | Status: DC

## 2015-10-22 MED ORDER — ACETAMINOPHEN 325 MG PO TABS
650.0000 mg | ORAL_TABLET | Freq: Once | ORAL | Status: AC
  Administered 2015-10-22: 650 mg via ORAL
  Filled 2015-10-22: qty 2

## 2015-10-22 MED ORDER — IPRATROPIUM-ALBUTEROL 0.5-2.5 (3) MG/3ML IN SOLN
3.0000 mL | Freq: Four times a day (QID) | RESPIRATORY_TRACT | Status: DC
  Administered 2015-10-23: 3 mL via RESPIRATORY_TRACT
  Filled 2015-10-22 (×2): qty 3

## 2015-10-22 MED ORDER — CEFTRIAXONE SODIUM 1 G IJ SOLR
1.0000 g | INTRAMUSCULAR | Status: DC
  Filled 2015-10-22: qty 10

## 2015-10-22 NOTE — ED Provider Notes (Signed)
CSN: [id number]     Arrival date & time 10/22/15  1429 History   First MD Initiated Contact with Patient 10/22/15 1547     Chief Complaint  Patient presents with  . Weakness     (Consider location/radiation/quality/duration/timing/severity/associated sxs/prior Treatment) HPI Comments: Patient presents with 2 day history of generalized weakness and fatigue. States he feels exhausted and wiped out. Denies any fever. Denies cough contrary to triage note. Denies any runny nose or sore throat. Denies any chest pain. Denies any shortness of breath. Denies any headache. Previous smoker who quit 3 months ago. No diagnosis of asthma or COPD. Takes medication for blood pressure. No heart or lung problems. No nausea vomiting or diarrhea. No focal weakness, numbness or tingling. No rashes or tick bites.  The history is provided by the patient. The history is limited by the condition of the patient.    Past Medical History  Diagnosis Date  . Syncope   . Hypertension   . Pneumonia    History reviewed. No pertinent past surgical history. History reviewed. No pertinent family history. Social History  Substance Use Topics  . Smoking status: Former Smoker -- 2.00 packs/day    Types: Cigarettes    Quit date: 02/03/2011  . Smokeless tobacco: Never Used  . Alcohol Use: No    Review of Systems  Constitutional: Positive for activity change, appetite change and fatigue. Negative for fever.  HENT: Positive for congestion. Negative for rhinorrhea and voice change.   Eyes: Negative for visual disturbance.  Respiratory: Positive for cough. Negative for chest tightness and shortness of breath.   Cardiovascular: Negative for chest pain and leg swelling.  Gastrointestinal: Negative for nausea, vomiting and abdominal pain.  Genitourinary: Negative for dysuria, urgency and hematuria.  Musculoskeletal: Negative for myalgias and arthralgias.  Skin: Negative for rash.  Neurological: Positive for weakness.  Negative for dizziness, facial asymmetry and headaches.  A complete 10 system review of systems was obtained and all systems are negative except as noted in the HPI and PMH.      Allergies  Review of patient's allergies indicates no known allergies.  Home Medications   Prior to Admission medications   Medication Sig Start Date End Date Taking? Authorizing Provider  albuterol (PROVENTIL HFA;VENTOLIN HFA) 108 (90 BASE) MCG/ACT inhaler Inhale 2 puffs into the lungs every 4 (four) hours as needed for wheezing. 06/13/12   Gilda Creasehristopher J Pollina, MD  carvedilol (COREG) 6.25 MG tablet Take 6.25 mg by mouth 2 (two) times daily with a meal.    Historical Provider, MD  HYDROcodone-acetaminophen (NORCO/VICODIN) 5-325 MG per tablet Take 1 tablet by mouth every 6 (six) hours as needed for pain. 05/26/12   Bethann BerkshireJoseph Zammit, MD  pantoprazole (PROTONIX) 40 MG tablet Take 40 mg by mouth daily.    Historical Provider, MD  predniSONE (DELTASONE) 20 MG tablet 3 tabs po day one, then 2 po daily x 4 days 06/13/12   Gilda Creasehristopher J Pollina, MD  tetrahydrozoline (EYE DROPS) 0.05 % ophthalmic solution Place 1 drop into both eyes daily as needed. For red eye    Historical Provider, MD   BP 118/83 mmHg  Pulse 106  Temp(Src) 99.3 F (37.4 C) (Oral)  Resp 18  Ht 5\' 9"  (1.753 m)  Wt 216 lb (97.977 kg)  BMI 31.88 kg/m2  SpO2 99% Physical Exam  Constitutional: He is oriented to person, place, and time. He appears well-developed and well-nourished. No distress.  Fatigued appearing  HENT:  Head: Normocephalic and atraumatic.  Mouth/Throat: Oropharynx is clear and moist. No oropharyngeal exudate.  OP clear. No asymmetry.  Cerumen bilateral ear canals  Eyes: Conjunctivae and EOM are normal. Pupils are equal, round, and reactive to light.  Neck: Normal range of motion. Neck supple.  No meningismus.  Cardiovascular: Normal rate, regular rhythm, normal heart sounds and intact distal pulses.   No murmur  heard. Pulmonary/Chest: Effort normal and breath sounds normal. No respiratory distress. He has no wheezes.  Abdominal: Soft. There is no tenderness. There is no rebound and no guarding.  Musculoskeletal: Normal range of motion. He exhibits no edema or tenderness.  Neurological: He is alert and oriented to person, place, and time. No cranial nerve deficit. He exhibits normal muscle tone. Coordination normal.  No ataxia on finger to nose bilaterally. No pronator drift. 5/5 strength throughout. CN 2-12 intact.Equal grip strength. Sensation intact.   Skin: Skin is warm.  Psychiatric: He has a normal mood and affect. His behavior is normal.  Nursing note and vitals reviewed.   ED Course  Procedures (including critical care time) Labs Review Labs Reviewed  BASIC METABOLIC PANEL - Abnormal; Notable for the following:    Sodium 132 (*)    Chloride 97 (*)    All other components within normal limits  CBC - Abnormal; Notable for the following:    WBC 10.6 (*)    Platelets 141 (*)    All other components within normal limits  URINALYSIS, ROUTINE W REFLEX MICROSCOPIC (NOT AT [facility]) - Abnormal; Notable for the following:    Hgb urine dipstick TRACE (*)    All other components within normal limits  TROPONIN I - Abnormal; Notable for the following:    Troponin I 0.04 (*)    All other components within normal limits  URINE MICROSCOPIC-ADD ON - Abnormal; Notable for the following:    Squamous Epithelial / LPF 0-5 (*)    Bacteria, UA RARE (*)    All other components within normal limits  RAPID STREP SCREEN (NOT AT [facility])  CULTURE, GROUP A STREP [facility])  TROPONIN I  TROPONIN I  TROPONIN I  BASIC METABOLIC PANEL  CBC    Imaging Review Dg Chest 2 View  10/22/2015  CLINICAL DATA:  Cough and congestion for 2 weeks. EXAM: CHEST  2 VIEW COMPARISON:  06/13/2012 FINDINGS: Cardiomediastinal silhouette is normal. Mediastinal contours appear intact. There is no evidence of pleural effusion or  pneumothorax. Subtle scattered peribronchovascular airspace consolidation with central predominance is seen in both lungs. Osseous structures are without acute abnormality. Soft tissues are grossly normal. IMPRESSION: Subtle scattered peribronchovascular airspace consolidations with central predominance and both lungs. Findings are nonspecific but may represent acute bronchitis versus early bronchopneumonia. Electronically Signed   By: Ted Mcalpine M.D.   On: 10/22/2015 14:51   I have personally reviewed and evaluated these images and lab results as part of my medical decision-making.   EKG Interpretation   Date/Time:  Tuesday October 22 2015 17:20:19 EDT Ventricular Rate:  94 PR Interval:    QRS Duration: 91 QT Interval:  345 QTC Calculation: 432 R Axis:   45 Text Interpretation:  Sinus rhythm Borderline repolarization abnormality  Nonspecific T wave abnormality Confirmed by Manus Gunning  MD, Cherril Hett [phone])  on 10/22/2015 5:25:01 PM      MDM   Final diagnoses:  CAP (community acquired pneumonia)  Elevated troponin   Fatigue for the past 2 days with subjective fever chills and intermittent cough. No chest pain, headache. No focal weakness.  Lungs  are clear no distress, chest x-ray shows peirbronchovascular airspace consolidations Orthostatics negative. Heart rate around 100. Temp 100.2 in the ED. Possible pneumonia on x-ray.  Nonspecific changes on EKG. Patient denies chest pain. Endorses generalized weakness and fatigue. Troponin 0.04. Ambulatory without desaturation.  Treat for probable community acquired pneumonia. Minimal troponin elevation with subtle EKG changes. Likely due to acute illness. No significant chest pain. Patient agreeable to overnight observation for IV fluids and antibiotics.  D/w Dr. Mariea Clonts.  Glynn Octave, MD 10/23/15 [phone]

## 2015-10-22 NOTE — ED Notes (Signed)
Pt states feeling weak and coughing two days ago.

## 2015-10-22 NOTE — H&P (Signed)
Patient Demographics:    Stephen Blanchard, is a 55 y.o. male  MRN: [id number]   DOB - 07/29/1960  Admit Date - 10/22/2015  Outpatient Primary MD for the patient is Samuel JesterYNTHIA BUTLER, DO   Assessment & Plan:    Active Problems:   CAP (community acquired pneumonia)   Elevated troponin   Pneumonia    1)Community-acquired pneumonia- treat empirically with IV Rocephin and IV azithromycin pending cultures. Dilators, mucolytics and supplemental oxygen as ordered. No hypoxia and patient does not meet sepsis criteria  2)Elevated Troponin- patient has borderline levels of troponin, suspected demand ischemia, no ACS type symptoms, get echocardiogram to rule out regional wall motion abnormalities and to evaluate EF. Continue Coreg and aspirin. Serial troponins have been ordered. Please get cardiology consult in a.m. if troponins trends up or if patient develops ACS symptoms or if echocardiogram shows regional wall motion abnormalities    With History of - Reviewed by me  Past Medical History  Diagnosis Date  . Syncope   . Hypertension   . Pneumonia       History reviewed. No pertinent past surgical history.    Chief Complaint  Patient presents with  . Weakness      HPI:    Stephen Blanchard  is a 55 y.o. male, Who is a reformed smoker with history of hypertension presents with 5-6 days history of productive cough, low-grade fevers and shortness of breath. Respiratory symptoms got worse over the last couple of days patient's wife is at bedside. No sick contacts at home no vomiting or diarrhea no frank chest pains no pleuritic symptoms no leg pains or leg swelling.     Review of systems:    In addition to the HPI above,   A full 12 point Review of Systems was done, except as stated above, all other Review of  Systems were negative.    Social History:  Reviewed by me    Social History  Substance Use Topics  . Smoking status: Former Smoker -- 2.00 packs/day    Types: Cigarettes    Quit date: 02/03/2011  . Smokeless tobacco: Never Used  . Alcohol Use: No       Family History :  Reviewed by me   History reviewed. No pertinent family history.    Home Medications:   Prior to Admission medications   Medication Sig Start Date End Date Taking? Authorizing Provider  carvedilol (COREG) 6.25 MG tablet Take 6.25 mg by mouth 2 (two) times daily with a meal.   Yes Historical Provider, MD  esomeprazole (NEXIUM) 20 MG capsule Take 20 mg by mouth every morning.   Yes Historical Provider, MD  tetrahydrozoline (EYE DROPS) 0.05 % ophthalmic solution Place 1 drop into both eyes daily as needed. For red eye   Yes Historical Provider, MD     Allergies:    No Known Allergies   Physical Exam:   Vitals  Blood pressure 127/85, pulse 83, temperature 98.4 F (36.9 C), temperature source Oral, resp. rate 18, height  (1.753 m), weight 95.845 kg (211 lb 4.8 oz), SpO2 99 %.  Physical Examination: General appearance - alert, well appearing, and in no distress  Mental status - alert, oriented to person, place, and time,  Eyes - sclera anicteric Neck - supple, no JVD elevation , Chest - scattered rhonchi bilaterally right more than left, no significant wheezing at this time Heart - S1 and S2 normal,  Abdomen - soft, nontender, nondistended, no masses or organomegaly Neurological - screening mental status exam normal, neck supple without rigidity, cranial nerves II through XII intact, DTR's normal and symmetric Extremities - no pedal edema noted, intact peripheral pulses  Skin - warm, dry    Data Review:    CBC  Recent Labs Lab 10/22/15 1452  WBC 10.6*  HGB 14.3  HCT 42.2  PLT 141*  MCV 85.4  MCH 28.9  MCHC 33.9  RDW 13.7    ------------------------------------------------------------------------------------------------------------------  Chemistries   Recent Labs Lab 10/22/15 1452  NA 132*  K 4.3  CL 97*  CO2 29  GLUCOSE 88  BUN 13  CREATININE 1.22  CALCIUM 9.0   ------------------------------------------------------------------------------------------------------------------ estimated creatinine clearance is 80.8 mL/min (by C-G formula based on Cr of 1.22). ------------------------------------------------------------------------------------------------------------------ No results for input(s): TSH, T4TOTAL, T3FREE, THYROIDAB in the last 72 hours.  Invalid input(s): FREET3   Coagulation profile No results for input(s): INR, PROTIME in the last 168 hours. ------------------------------------------------------------------------------------------------------------------- No results for input(s): DDIMER in the last 72 hours. -------------------------------------------------------------------------------------------------------------------  Cardiac Enzymes  Recent Labs Lab 10/22/15 1452  TROPONINI 0.04*   ------------------------------------------------------------------------------------------------------------------ No results found for: BNP   ---------------------------------------------------------------------------------------------------------------  Urinalysis    Component Value Date/Time   COLORURINE YELLOW 10/22/2015 1610   APPEARANCEUR CLEAR 10/22/2015 1610   LABSPEC 1.010 10/22/2015 1610   PHURINE 6.5 10/22/2015 1610   GLUCOSEU NEGATIVE 10/22/2015 1610   HGBUR TRACE* 10/22/2015 1610   BILIRUBINUR NEGATIVE 10/22/2015 1610   KETONESUR NEGATIVE 10/22/2015 1610   PROTEINUR NEGATIVE 10/22/2015 1610   UROBILINOGEN 0.2 06/13/2012 1514   NITRITE NEGATIVE 10/22/2015 1610   LEUKOCYTESUR NEGATIVE 10/22/2015 1610     ----------------------------------------------------------------------------------------------------------------   Imaging Results:    Dg Chest 2 View  10/22/2015  CLINICAL DATA:  Cough and congestion for 2 weeks. EXAM: CHEST  2 VIEW COMPARISON:  06/13/2012 FINDINGS: Cardiomediastinal silhouette is normal. Mediastinal contours appear intact. There is no evidence of pleural effusion or pneumothorax. Subtle scattered peribronchovascular airspace consolidation with central predominance is seen in both lungs. Osseous structures are without acute abnormality. Soft tissues are grossly normal. IMPRESSION: Subtle scattered peribronchovascular airspace consolidations with central predominance and both lungs. Findings are nonspecific but may represent acute bronchitis versus early bronchopneumonia. Electronically Signed   By: Ted Mcalpine M.D.   On: 10/22/2015 14:51    Radiological Exams on Admission: Dg Chest 2 View  10/22/2015  CLINICAL DATA:  Cough and congestion for 2 weeks. EXAM: CHEST  2 VIEW COMPARISON:  06/13/2012 FINDINGS: Cardiomediastinal silhouette is normal. Mediastinal contours appear intact. There is no evidence of pleural effusion or pneumothorax. Subtle scattered peribronchovascular airspace consolidation with central predominance is seen in both lungs. Osseous structures are without acute abnormality. Soft tissues are grossly normal. IMPRESSION: Subtle scattered peribronchovascular airspace consolidations with central predominance and both lungs. Findings are nonspecific but may represent acute bronchitis versus early bronchopneumonia. Electronically Signed   By: Ted Mcalpine M.D.   On: 10/22/2015 14:51  DVT Prophylaxis heparin  AM Labs Ordered, also please review Full Orders  Family Communication: Admission, patients condition and plan of care including tests being ordered have been discussed with the patient and wife who indicate understanding and agree with the plan    Code Status - Full Code  Likely DC to  home in a couple of days  Condition   stable  Brailon Don M.D on 10/22/2015 at 9:33 PM   Between 7am to 7pm - Pager - [phone]  After 7pm go to [email or web address] - password [email or web address]  [facility] - Office  [phone]  Dragon dictation system was used to create this note, attempts have been made to correct errors, however presence of uncorrected errors is not a reflection quality of care provided.

## 2015-10-23 ENCOUNTER — Inpatient Hospital Stay (HOSPITAL_COMMUNITY): Payer: BLUE CROSS/BLUE SHIELD

## 2015-10-23 DIAGNOSIS — R7989 Other specified abnormal findings of blood chemistry: Secondary | ICD-10-CM

## 2015-10-23 DIAGNOSIS — J189 Pneumonia, unspecified organism: Principal | ICD-10-CM

## 2015-10-23 DIAGNOSIS — I10 Essential (primary) hypertension: Secondary | ICD-10-CM

## 2015-10-23 LAB — ECHOCARDIOGRAM COMPLETE BUBBLE STUDY
CHL CUP STROKE VOLUME: 47 mL
EERAT: 10.19
EWDT: 204 ms
FS: 37 % (ref 28–44)
IV/PV OW: 0.96
LA ID, A-P, ES: 32 mm
LA diam index: 1.46 cm/m2
LA vol A4C: 52 ml
LAVOL: 53 mL
LAVOLIN: 24.2 mL/m2
LEFT ATRIUM END SYS DIAM: 32 mm
LV E/e' medial: 10.19
LV PW d: 9.42 mm — AB (ref 0.6–1.1)
LV SIMPSON'S DISK: 63
LV dias vol: 75 mL (ref 62–150)
LV e' LATERAL: 9.25 cm/s
LV sys vol index: 13 mL/m2
LV sys vol: 28 mL (ref 21–61)
LVDIAVOLIN: 34 mL/m2
LVEEAVG: 10.19
LVOT area: 3.46 cm2
LVOTD: 21 mm
Lateral S' vel: 14.1 cm/s
MV Dec: 204
MV pk A vel: 64.1 m/s
MV pk E vel: 94.3 m/s
MVPG: 4 mmHg
TAPSE: 24.3 mm
TDI e' lateral: 9.25
TDI e' medial: 9.9

## 2015-10-23 LAB — BASIC METABOLIC PANEL
Anion gap: 5 (ref 5–15)
BUN: 15 mg/dL (ref 6–20)
CO2: 27 mmol/L (ref 22–32)
Calcium: 8.5 mg/dL — ABNORMAL LOW (ref 8.9–10.3)
Chloride: 102 mmol/L (ref 101–111)
Creatinine, Ser: 1.07 mg/dL (ref 0.61–1.24)
GFR calc Af Amer: >60 mL/min (ref >60)
GLUCOSE: 114 mg/dL — AB (ref 65–99)
POTASSIUM: 3.9 mmol/L (ref 3.5–5.1)
Sodium: 134 mmol/L — ABNORMAL LOW (ref 135–145)

## 2015-10-23 LAB — CBC
HEMATOCRIT: 40.4 % (ref 39.0–52.0)
Hemoglobin: 13.5 g/dL (ref 13.0–17.0)
MCH: 28.6 pg (ref 26.0–34.0)
MCHC: 33.4 g/dL (ref 30.0–36.0)
MCV: 85.6 fL (ref 78.0–100.0)
Platelets: 119 10*3/uL — ABNORMAL LOW (ref 150–400)
RBC: 4.72 MIL/uL (ref 4.22–5.81)
RDW: 13.7 % (ref 11.5–15.5)
WBC: 8.7 10*3/uL (ref 4.0–10.5)

## 2015-10-23 LAB — GLUCOSE, CAPILLARY: GLUCOSE-CAPILLARY: 115 mg/dL — AB (ref 65–99)

## 2015-10-23 LAB — TROPONIN I
Troponin I: <0.03 ng/mL (ref <0.031)
Troponin I: <0.03 ng/mL (ref <0.031)

## 2015-10-23 MED ORDER — LEVOFLOXACIN 500 MG PO TABS: 500.0000 mg | ORAL_TABLET | Freq: Every day | ORAL | Status: DC

## 2015-10-23 NOTE — Discharge Instructions (Signed)
Follow with CYNTHIA BUTLER, DO in 5-7 days  Please get a complete blood count and chemistry panel checked by your Primary MD at your next visit, and again as instructed by your Primary MD. Please get your medications reviewed and adjusted by your Primary MD.  Please request your Primary MD to go over all Hospital Tests and Procedure/Radiological results at the follow up, please get all Hospital records sent to your Prim MD by signing hospital release before you go home.  If you had Pneumonia of Lung problems at the Hospital: Please get a 2 view Chest X ray done in 6-8 weeks after hospital discharge or sooner if instructed by your Primary MD.  If you have Congestive Heart Failure: Please call your Cardiologist or Primary MD anytime you have any of the following symptoms:  1) 3 pound weight gain in 24 hours or 5 pounds in 1 week  2) shortness of breath, with or without a dry hacking cough  3) swelling in the hands, feet or stomach  4) if you have to sleep on extra pillows at night in order to breathe  Follow cardiac low salt diet and 1.5 lit/day fluid restriction.  If you have diabetes Accuchecks 4 times/day, Once in AM empty stomach and then before each meal. Log in all results and show them to your primary doctor at your next visit. If any glucose reading is under 80 or above 300 call your primary MD immediately.  If you have Seizure/Convulsions/Epilepsy: Please do not drive, operate heavy machinery, participate in activities at heights or participate in high speed sports until you have seen by Primary MD or a Neurologist and advised to do so again.  If you had Gastrointestinal Bleeding: Please ask your Primary MD to check a complete blood count within one week of discharge or at your next visit. Your endoscopic/colonoscopic biopsies that are pending at the time of discharge, will also need to followed by your Primary MD.  Get Medicines reviewed and adjusted. Please take all your  medications with you for your next visit with your Primary MD  Please request your Primary MD to go over all hospital tests and procedure/radiological results at the follow up, please ask your Primary MD to get all Hospital records sent to his/her office.  If you experience worsening of your admission symptoms, develop shortness of breath, life threatening emergency, suicidal or homicidal thoughts you must seek medical attention immediately by calling 911 or calling your MD immediately  if symptoms less severe.  You must read complete instructions/literature along with all the possible adverse reactions/side effects for all the Medicines you take and that have been prescribed to you. Take any new Medicines after you have completely understood and accpet all the possible adverse reactions/side effects.   Do not drive or operate heavy machinery when taking Pain medications.   Do not take more than prescribed Pain, Sleep and Anxiety Medications  Special Instructions: If you have smoked or chewed Tobacco  in the last 2 yrs please stop smoking, stop any regular Alcohol  and or any Recreational drug use.  Wear Seat belts while driving.  Please note You were cared for by a hospitalist during your hospital stay. If you have any questions about your discharge medications or the care you received while you were in the hospital after you are discharged, you can call the unit and asked to speak with the hospitalist on call if the hospitalist that took care of you is not available. Once  you are discharged, your primary care physician will handle any further medical issues. Please note that NO REFILLS for any discharge medications will be authorized once you are discharged, as it is imperative that you return to your primary care physician (or establish a relationship with a primary care physician if you do not have one) for your aftercare needs so that they can reassess your need for medications and monitor your  lab values.  You can reach the hospitalist office at phone [phone] or fax [phone]   If you do not have a primary care physician, you can call [phone] for a physician referral.  Activity: As tolerated with Full fall precautions use walker/cane & assistance as needed  Diet: regular  Disposition Home

## 2015-10-23 NOTE — Discharge Summary (Signed)
Physician Discharge Summary  Stephen Blanchard RUE:[id number] DOB: July 19, 1960 DOA: 10/22/2015  PCP: Samuel Jester, DO  Admit date: 10/22/2015 Discharge date: 10/23/2015  Time spent: > 30 minutes  Recommendations for Outpatient Follow-up:  1. Follow up with Dr. Charm Barges in 2-4 weeks   Discharge Diagnoses:  Active Problems:   CAP (community acquired pneumonia)   Elevated troponin   Pneumonia   HTN (hypertension)  Discharge Condition: stable  Diet recommendation: regular  Filed Weights   10/22/15 1436 10/22/15 1933  Weight: 97.977 kg (216 lb) 95.845 kg (211 lb 4.8 oz)    History of present illness:  See H&P, Labs, Consult and Test reports for all details in brief, patient is a 55 y.o. male, Who is a reformed smoker with history of hypertension presents with 5-6 days history of productive cough, low-grade fevers and shortness of breath. Respiratory symptoms got worse over the last couple of days patient's wife is at bedside. No sick contacts at home no vomiting or diarrhea no frank chest pains no pleuritic symptoms no leg pains or leg swelling.   Hospital Course:  Patient was admitted to the hospital with community-acquired pneumonia and started on ceftriaxone and azithromycin. Clinically he improved with antibiotics, on 6/21 he was feeling back to baseline, comfortable on room air, ambulatory and without further complaints, asking to go home. He is to be sent home to finish antibiotic course is now outpatient with 5 additional days of Levaquin. On admission his troponin was borderline elevated at 0.04, with repeat values within normal range, likely suggesting demand in the setting of pneumonia. He underwent a 2-D echo which showed normal ejection fraction and no wall motion abnormalities. He was chest pain-free and was discharged home in stable condition. He was mildly thrombocytopenic in the setting of an active infection, he has a history of thrombocytopenia in the past, this can be  worked up as an outpatient if it doesn't improve after treatment of his pneumonia.   Procedures:  2D echo  Study Conclusions - Procedure narrative: Transthoracic echocardiography. Image quality was adequate. The study was technically difficult. Intravenous contrast (agitated saline) was administered. Left ventricle: The cavity size was normal. Wall thickness was normal. Systolic function was normal. The estimated ejection fraction was in the range of 60% to 65%. Wall motion was normal; there were no regional wall motion abnormalities. Left ventricular diastolic function parameters were normal. - Atrial septum: No defect or patent foramen ovale was identified.  Consultations:  None   Discharge Exam: Filed Vitals:   10/22/15 2151 10/23/15 0640 10/23/15 0755 10/23/15 1409  BP: 130/71 132/81  129/87  Pulse: 86 81  71  Temp: 98.4 F (36.9 C) 98.3 F (36.8 C)  98.4 F (36.9 C)  TempSrc: Oral Oral  Oral  Resp: 18 20  18   Height:      Weight:      SpO2: 99% 96% 97% 100%    General: NAD Cardiovascular: RRR Respiratory: CTA biL  Discharge Instructions Activity:  As tolerated   Get Medicines reviewed and adjusted: Please take all your medications with you for your next visit with your Primary MD  Please request your Primary MD to go over all hospital tests and procedure/radiological results at the follow up, please ask your Primary MD to get all Hospital records sent to his/her office.  If you experience worsening of your admission symptoms, develop shortness of breath, life threatening emergency, suicidal or homicidal thoughts you must seek medical attention immediately by calling 911  or calling your MD immediately if symptoms less severe.  You must read complete instructions/literature along with all the possible adverse reactions/side effects for all the Medicines you take and that have been prescribed to you. Take any new Medicines after you have completely understood and  accpet all the possible adverse reactions/side effects.   Do not drive when taking Pain medications.   Do not take more than prescribed Pain, Sleep and Anxiety Medications  Special Instructions: If you have smoked or chewed Tobacco in the last 2 yrs please stop smoking, stop any regular Alcohol and or any Recreational drug use.  Wear Seat belts while driving.  Please note  You were cared for by a hospitalist during your hospital stay. Once you are discharged, your primary care physician will handle any further medical issues. Please note that NO REFILLS for any discharge medications will be authorized once you are discharged, as it is imperative that you return to your primary care physician (or establish a relationship with a primary care physician if you do not have one) for your aftercare needs so that they can reassess your need for medications and monitor your lab values.    Medication List    TAKE these medications        carvedilol 6.25 MG tablet  Commonly known as:  COREG  Take 6.25 mg by mouth 2 (two) times daily with a meal.     esomeprazole 20 MG capsule  Commonly known as:  NEXIUM  Take 20 mg by mouth every morning.     EYE DROPS 0.05 % ophthalmic solution  Generic drug:  tetrahydrozoline  Place 1 drop into both eyes daily as needed. For red eye     levofloxacin 500 MG tablet  Commonly known as:  LEVAQUIN  Take 1 tablet (500 mg total) by mouth daily.       Follow-up Information    Follow up with CYNTHIA BUTLER, DO.   Why:  As needed   Contact information:   [address] [place] [phone]       The results of significant diagnostics from this hospitalization (including imaging, microbiology, ancillary and laboratory) are listed below for reference.    Significant Diagnostic Studies: Dg Chest 2 View  10/22/2015  CLINICAL DATA:  Cough and congestion for 2 weeks. EXAM: CHEST  2 VIEW COMPARISON:  06/13/2012 FINDINGS: Cardiomediastinal  silhouette is normal. Mediastinal contours appear intact. There is no evidence of pleural effusion or pneumothorax. Subtle scattered peribronchovascular airspace consolidation with central predominance is seen in both lungs. Osseous structures are without acute abnormality. Soft tissues are grossly normal. IMPRESSION: Subtle scattered peribronchovascular airspace consolidations with central predominance and both lungs. Findings are nonspecific but may represent acute bronchitis versus early bronchopneumonia. Electronically Signed   By: Ted Mcalpineobrinka  Dimitrova M.D.   On: 10/22/2015 14:51    Microbiology: Recent Results (from the past 240 hour(s))  Rapid strep screen     Status: None   Collection Time: 10/22/15  4:10 PM  Result Value Ref Range Status   Streptococcus, Group A Screen (Direct) NEGATIVE NEGATIVE Final    Comment: (NOTE) A Rapid Antigen test may result negative if the antigen level in the sample is below the detection level of this test. The FDA has not cleared this test as a stand-alone test therefore the rapid antigen negative result has reflexed to a Group A Strep culture.   Culture, group A strep     Status: None (Preliminary result)  Collection Time: 10/22/15  4:10 PM  Result Value Ref Range Status   Specimen Description THROAT  Final   Special Requests NONE Reflexed from [phone]  Final   Culture   Final    CULTURE REINCUBATED FOR BETTER GROWTH Performed at [facility]    Report Status PENDING  Incomplete     Labs: Basic Metabolic Panel:  Recent Labs Lab 10/22/15 1452 10/23/15 0335  NA 132* 134*  K 4.3 3.9  CL 97* 102  CO2 29 27  GLUCOSE 88 114*  BUN 13 15  CREATININE 1.22 1.07  CALCIUM 9.0 8.5*   Liver Function Tests: No results for input(s): AST, ALT, ALKPHOS, BILITOT, PROT, ALBUMIN in the last 168 hours. No results for input(s): LIPASE, AMYLASE in the last 168 hours. No results for input(s): AMMONIA in the last 168 hours. CBC:  Recent Labs Lab  10/22/15 1452 10/23/15 0335  WBC 10.6* 8.7  HGB 14.3 13.5  HCT 42.2 40.4  MCV 85.4 85.6  PLT 141* 119*   Cardiac Enzymes:  Recent Labs Lab 10/22/15 1452 10/22/15 2207 10/23/15 0335 10/23/15 0908  TROPONINI 0.04* <0.03 <0.03 <0.03   BNP: BNP (last 3 results) No results for input(s): BNP in the last 8760 hours.  ProBNP (last 3 results) No results for input(s): PROBNP in the last 8760 hours.  CBG:  Recent Labs Lab 10/23/15 0815  GLUCAP 115*       Signed:  Pamella Pert  [facility] 10/23/2015, 4:33 PM

## 2015-10-23 NOTE — Care Management Note (Signed)
Case Management Note  Patient Details  Name: Stephen Blanchard MRN: [id number] Date of Birth: 12/07/1960  Subjective/Objective: Patient is from home with wife. He is independent with ADL's. Has had no HH or DME in the past. His PCP is Dr. Charm BargesButler. He has insurance and has no issues obtaining medications.                   Action/Plan: Anticipated discharge today after ECHO if appropriate. No CM needs.   Expected Discharge Date:       10/23/2015           Expected Discharge Plan:  Home/Self Care  In-House Referral:     Discharge planning Services  CM Consult  Post Acute Care Choice:  NA Choice offered to:  NA  DME Arranged:    DME Agency:     HH Arranged:    HH Agency:     Status of Service:  Completed, signed off  If discussed at [employer] Length of Stay Meetings, dates discussed:    Additional Comments:  Ayshia Gramlich, Chrystine OilerSharley Diane, RN 10/23/2015, 10:52 AM

## 2015-10-25 LAB — CULTURE, GROUP A STREP (THRC)

## 2017-05-04 DIAGNOSIS — S82112A Displaced fracture of left tibial spine, initial encounter for closed fracture: Secondary | ICD-10-CM

## 2017-05-04 HISTORY — DX: Displaced fracture of left tibial spine, initial encounter for closed fracture: S82.112A

## 2017-05-14 ENCOUNTER — Emergency Department (HOSPITAL_COMMUNITY)
Admission: EM | Admit: 2017-05-14 | Discharge: 2017-05-14 | Disposition: A | Discharge status: Home / Self Care | Payer: 59 | Attending: Emergency Medicine | Admitting: Emergency Medicine

## 2017-05-14 ENCOUNTER — Emergency Department (HOSPITAL_COMMUNITY): Payer: 59

## 2017-05-14 ENCOUNTER — Encounter (HOSPITAL_COMMUNITY): Payer: Self-pay | Admitting: Emergency Medicine

## 2017-05-14 ENCOUNTER — Other Ambulatory Visit: Payer: Self-pay

## 2017-05-14 DIAGNOSIS — Y929 Unspecified place or not applicable: Secondary | ICD-10-CM | POA: Diagnosis not present

## 2017-05-14 DIAGNOSIS — Z87891 Personal history of nicotine dependence: Secondary | ICD-10-CM | POA: Insufficient documentation

## 2017-05-14 DIAGNOSIS — Y939 Activity, unspecified: Secondary | ICD-10-CM | POA: Insufficient documentation

## 2017-05-14 DIAGNOSIS — Y999 Unspecified external cause status: Secondary | ICD-10-CM | POA: Insufficient documentation

## 2017-05-14 DIAGNOSIS — I10 Essential (primary) hypertension: Secondary | ICD-10-CM | POA: Diagnosis not present

## 2017-05-14 DIAGNOSIS — Z79899 Other long term (current) drug therapy: Secondary | ICD-10-CM | POA: Insufficient documentation

## 2017-05-14 DIAGNOSIS — W11XXXA Fall on and from ladder, initial encounter: Secondary | ICD-10-CM | POA: Diagnosis not present

## 2017-05-14 DIAGNOSIS — M25562 Pain in left knee: Secondary | ICD-10-CM | POA: Insufficient documentation

## 2017-05-14 MED ORDER — HYDROCODONE-ACETAMINOPHEN 5-325 MG PO TABS: ORAL_TABLET | ORAL | 0 refills | Status: DC

## 2017-05-14 NOTE — ED Provider Notes (Signed)
[facility] EMERGENCY DEPARTMENT Provider Note   CSN: [id number] Arrival date & time: 05/14/17  1044     History   Chief Complaint Chief Complaint  Patient presents with  . Fall    20 feet off of a ladder    HPI Stephen Blanchard is a 57 y.o. male.  HPI  Stephen Blanchard is a 57 y.o. male who presents to the Emergency Department complaining of left knee pain and swelling.  He states that he fell approximately 20 feet off a ladder landing on his left side.  Injury occurred yesterday.  He complains of pain to his left knee associated with bending and weightbearing.  They were noticed increased swelling to his knee today.  He is taking over-the-counter pain relievers with minimal relief.  He denies head injury, LOC, neck or back pain or pain to the opposing  Knee.  Past Medical History:  Diagnosis Date  . Hypertension   . Pneumonia   . Syncope     Patient Active Problem List   Diagnosis Date Noted  . HTN (hypertension) 10/23/2015  . CAP (community acquired pneumonia) 10/22/2015  . Elevated troponin 10/22/2015  . Pneumonia 10/22/2015  . Syncope 02/03/2012  . AKI (acute kidney injury) (HCC) 02/03/2012  . Hypercalcemia 02/03/2012    History reviewed. No pertinent surgical history.     Home Medications    Prior to Admission medications   Medication Sig Start Date End Date Taking? Authorizing Provider  carvedilol (COREG) 6.25 MG tablet Take 6.25 mg by mouth 2 (two) times daily with a meal.    [provider]  esomeprazole (NEXIUM) 20 MG capsule Take 20 mg by mouth every morning.    [provider]  HYDROcodone-acetaminophen (NORCO/VICODIN) 5-325 MG tablet Take one tab po q 4 hrs prn pain 05/14/17   Graceann Boileau, PA-C  levofloxacin (LEVAQUIN) 500 MG tablet Take 1 tablet (500 mg total) by mouth daily. 10/23/15   Leatha Gilding, MD  tetrahydrozoline (EYE DROPS) 0.05 % ophthalmic solution Place 1 drop into both eyes daily as needed. For red eye     [provider]    Family History No family history on file.  Social History Social History   Tobacco Use  . Smoking status: Former Smoker    Packs/day: 2.00    Types: Cigarettes    Last attempt to quit: 02/03/2011    Years since quitting: 6.2  . Smokeless tobacco: Never Used  Substance Use Topics  . Alcohol use: No  . Drug use: No     Allergies   Patient has no known allergies.   Review of Systems Review of Systems  Constitutional: Negative for chills and fever.  Eyes: Negative for visual disturbance.  Respiratory: Negative for shortness of breath.   Cardiovascular: Negative for chest pain.  Gastrointestinal: Negative for abdominal pain, nausea and vomiting.  Musculoskeletal: Positive for arthralgias (Left knee pain and swelling) and joint swelling. Negative for back pain and neck pain.  Skin: Negative for color change and wound.  Neurological: Negative for dizziness, syncope, weakness, numbness and headaches.  All other systems reviewed and are negative.    Physical Exam Updated Vital Signs BP 123/82 (BP Location: Left Arm)   Pulse 77   Temp 98.5 F (36.9 C) (Oral)   Resp 18   Ht 5\' 9"  (1.753 m)   Wt 91.6 kg (202 lb)   SpO2 99%   BMI 29.83 kg/m   Physical Exam  Constitutional: He is oriented  to person, place, and time. He appears well-developed and well-nourished. No distress.  HENT:  Head: Atraumatic.  Eyes: EOM are normal. Pupils are equal, round, and reactive to light.  Neck: Normal range of motion.  Cardiovascular: Normal rate, regular rhythm and intact distal pulses.  Pulmonary/Chest: Effort normal and breath sounds normal. He exhibits no tenderness.  Musculoskeletal: He exhibits tenderness. He exhibits no edema or deformity.  Tenderness to palpation of the anterior left knee.  Mild to moderate edema anteriorly.  No tenderness to the left lower leg or hip.  No spinal tenderness on exam.  Compartments are soft  Neurological: He is alert and  oriented to person, place, and time. He exhibits normal muscle tone. Coordination normal.  Skin: Skin is warm and dry. Capillary refill takes less than 2 seconds. No erythema.  Nursing note and vitals reviewed.    ED Treatments / Results  Labs (all labs ordered are listed, but only abnormal results are displayed) Labs Reviewed - No data to display  EKG  EKG Interpretation None       Radiology Dg Knee Complete 4 Views Left  Result Date: 05/14/2017 CLINICAL DATA:  Left knee pain.  Prior fall. EXAM: LEFT KNEE - COMPLETE 4+ VIEW COMPARISON:  No recent. FINDINGS: Tiny knee joint effusion cannot be excluded. Tricompartment degenerative change. Tiny loose body versus fracture fragment noted adjacent to the lateral tibial spine. IMPRESSION: 1.  Tiny knee joint effusion.  Tricompartment degenerative change. 2. Tiny loose body versus tiny fracture fragment noted adjacent to the lateral tibial spine. No focal bony abnormality otherwise noted. Electronically Signed   By: Maisie Fushomas  Register   On: 05/14/2017 11:52    Procedures Procedures (including critical care time)  Medications Ordered in ED Medications - No data to display   Initial Impression / Assessment and Plan / ED Course  I have reviewed the triage vital signs and the nursing notes.  Pertinent labs & imaging results that were available during my care of the patient were reviewed by me and considered in my medical decision making (see chart for details).     Given mechanism of injury, and possible small fracture on x-ray, I will instruct patient to be nonweightbearing, apply knee immobilizer and knee sleeve.  Patient agrees to close follow-up with local orthopedics.  Knee immobilizer, knee sleeve, and crutches given.  Patient agrees to elevate and ice. Final Clinical Impressions(s) / ED Diagnoses   Final diagnoses:  Acute pain of left knee    ED Discharge Orders        Ordered    HYDROcodone-acetaminophen (NORCO/VICODIN)  5-325 MG tablet     05/14/17 1248       Pauline Ausriplett, Ozie Lupe, PA-C 05/14/17 2114    Blane OharaZavitz, Joshua, MD 05/16/17 [phone]

## 2017-05-14 NOTE — ED Triage Notes (Signed)
Fell 20 ft off of a ladder yesterday  Complains of L knee pain

## 2017-05-14 NOTE — Discharge Instructions (Signed)
Use your crutches for weightbearing.  Elevate and apply ice on and off to your knee.  Call Dr. Mort SawyersHarrison's office to arrange a follow-up appointment for next week.  Return to the ER for any worsening symptoms.

## 2017-05-18 ENCOUNTER — Encounter: Payer: Self-pay | Admitting: Orthopaedic Surgery

## 2017-05-18 ENCOUNTER — Ambulatory Visit (INDEPENDENT_AMBULATORY_CARE_PROVIDER_SITE_OTHER): Payer: 59 | Admitting: Orthopaedic Surgery

## 2017-05-18 VITALS — BP 160/102 | HR 76 | Ht 69.0 in | Wt 215.0 lb

## 2017-05-18 DIAGNOSIS — M25562 Pain in left knee: Secondary | ICD-10-CM | POA: Diagnosis not present

## 2017-05-18 NOTE — Patient Instructions (Signed)
OUT OF WORK ?

## 2017-05-18 NOTE — Progress Notes (Signed)
Subjective:    Patient ID: Stephen Blanchard, male    DOB: 08/25/1960, 57 y.o.   MRN: [id number]  HPI He fell off a ladder and hurt his left knee 05-14-17.  He was seen in the ER.  X-rays showed possible very tiny fracture fragment adjacent to lateral tibial spine.  He was given crutches, knee immobilizer and told to take Advil.  He is doing better. He has less pain and less swelling. He has no other injury.  He is using his crutches well.   Review of Systems  Musculoskeletal: Positive for arthralgias, gait problem and joint swelling.  All other systems reviewed and are negative.  Past Medical History:  Diagnosis Date  . Hypertension   . Pneumonia   . Syncope     History reviewed. No pertinent surgical history.  Current Outpatient Medications on File Prior to Visit  Medication Sig Dispense Refill  . carvedilol (COREG) 12.5 MG tablet     . esomeprazole (NEXIUM) 20 MG capsule Take 20 mg by mouth every morning.    Marland Kitchen. HYDROcodone-acetaminophen (NORCO/VICODIN) 5-325 MG tablet Take one tab po q 4 hrs prn pain 12 tablet 0  . tetrahydrozoline (EYE DROPS) 0.05 % ophthalmic solution Place 1 drop into both eyes daily as needed. For red eye     No current facility-administered medications on file prior to visit.     Social History   Socioeconomic History  . Marital status: Married    Spouse name: Not on file  . Number of children: Not on file  . Years of education: Not on file  . Highest education level: Not on file  Social Needs  . Financial resource strain: Not on file  . Food insecurity - worry: Not on file  . Food insecurity - inability: Not on file  . Transportation needs - medical: Not on file  . Transportation needs - non-medical: Not on file  Occupational History  . Not on file  Tobacco Use  . Smoking status: Former Smoker    Packs/day: 2.00    Types: Cigarettes    Last attempt to quit: 02/03/2011    Years since quitting: 6.2  . Smokeless tobacco: Never Used  Substance  and Sexual Activity  . Alcohol use: No  . Drug use: No  . Sexual activity: Not on file  Other Topics Concern  . Not on file  Social History Narrative  . Not on file    History reviewed. No pertinent family history.  BP (!) 160/102   Pulse 76   Ht 5\' 9"  (1.753 m)   Wt 215 lb (97.5 kg)   BMI 31.75 kg/m      Objective:   Physical Exam  Constitutional: He is oriented to person, place, and time. He appears well-developed and well-nourished.  HENT:  Head: Normocephalic and atraumatic.  Eyes: Conjunctivae and EOM are normal. Pupils are equal, round, and reactive to light.  Neck: Normal range of motion. Neck supple.  Cardiovascular: Normal rate, regular rhythm and intact distal pulses.  Pulmonary/Chest: Effort normal.  Abdominal: Soft.  Musculoskeletal: He exhibits tenderness (Left knee tender, ROM 0 to 110, slight crepitus, slight effusion, stable, NV intact.  Right knee negative.  He has callus on anterior knee from excessive squatting and rubbing.).  Neurological: He is alert and oriented to person, place, and time. He displays abnormal reflex. No cranial nerve deficit. He exhibits normal muscle tone. Coordination normal.  Skin: Skin is warm and dry.  Psychiatric: He has a  normal mood and affect. His behavior is normal. Judgment and thought content normal.  Vitals reviewed.    I have reviewed the ER records, x-rays and reports.     Assessment & Plan:   Encounter Diagnosis  Name Primary?  . Acute pain of left knee Yes   Continue crutches.  He can stop knee immobilizer as needed.  Continue the ibuprofen.  Return in one week.  Stay out of work.  Call if any problem.  Precautions discussed.   Electronically Signed Darreld Mclean, MD 1/15/20199:47 AM

## 2017-05-25 ENCOUNTER — Encounter: Payer: Self-pay | Admitting: Orthopaedic Surgery

## 2017-05-25 ENCOUNTER — Ambulatory Visit (INDEPENDENT_AMBULATORY_CARE_PROVIDER_SITE_OTHER): Payer: 59 | Admitting: Orthopaedic Surgery

## 2017-05-25 VITALS — BP 155/102 | HR 69 | Ht 69.0 in | Wt 215.0 lb

## 2017-05-25 DIAGNOSIS — S82115D Nondisplaced fracture of left tibial spine, subsequent encounter for closed fracture with routine healing: Secondary | ICD-10-CM | POA: Diagnosis not present

## 2017-05-25 NOTE — Progress Notes (Signed)
Patient ZO:XWRUE:Stephen Blanchard, male DOB:11/22/1960, 57 y.o. AVW:[id number]  Chief Complaint  Patient presents with  . Follow-up    left knee    HPI  Cindy HazyBenny W Meadowcroft is a 57 y.o. male who has continued pain of the left knee.  He has a tibial spine fracture.  He is on crutches and has continued swelling.  I would like to get a MRI to check the cruciate ligaments and also the meniscus of the knee from his trauma.  He is to continue the crutches. HPI  Body mass index is 31.75 kg/m.  ROS  Review of Systems  Musculoskeletal: Positive for arthralgias, gait problem and joint swelling.  All other systems reviewed and are negative.   Past Medical History:  Diagnosis Date  . Hypertension   . Pneumonia   . Syncope     History reviewed. No pertinent surgical history.  History reviewed. No pertinent family history.  Social History Social History   Tobacco Use  . Smoking status: Former Smoker    Packs/day: 2.00    Types: Cigarettes    Last attempt to quit: 02/03/2011    Years since quitting: 6.3  . Smokeless tobacco: Never Used  Substance Use Topics  . Alcohol use: No  . Drug use: No    No Known Allergies  Current Outpatient Medications  Medication Sig Dispense Refill  . carvedilol (COREG) 12.5 MG tablet     . esomeprazole (NEXIUM) 20 MG capsule Take 20 mg by mouth every morning.    Marland Kitchen. HYDROcodone-acetaminophen (NORCO/VICODIN) 5-325 MG tablet Take one tab po q 4 hrs prn pain 12 tablet 0  . tetrahydrozoline (EYE DROPS) 0.05 % ophthalmic solution Place 1 drop into both eyes daily as needed. For red eye     No current facility-administered medications for this visit.      Physical Exam  Blood pressure (!) 155/102, pulse 69, height 5\' 9"  (1.753 m), weight 215 lb (97.5 kg).  Constitutional: overall normal hygiene, normal nutrition, well developed, normal grooming, normal body habitus. Assistive device:crutches  Musculoskeletal: gait and station Limp left, muscle tone and  strength are normal, no tremors or atrophy is present.  .  Neurological: coordination overall normal.  Deep tendon reflex/nerve stretch intact.  Sensation normal.  Cranial nerves II-XII intact.   Skin:   Normal overall no scars, lesions, ulcers or rashes. No psoriasis.  Psychiatric: Alert and oriented x 3.  Recent memory intact, remote memory unclear.  Normal mood and affect. Well groomed.  Good eye contact.  Cardiovascular: overall no swelling, no varicosities, no edema bilaterally, normal temperatures of the legs and arms, no clubbing, cyanosis and good capillary refill.  Lymphatic: palpation is normal.  L:eft knee with effusion, ROM 0 to 100, painful ROM, NV intact.  All other systems reviewed and are negative   The patient has been educated about the nature of the problem(s) and counseled on treatment options.  The patient appeared to understand what I have discussed and is in agreement with it.  Encounter Diagnosis  Name Primary?  . Closed nondisplaced fracture of spine of left tibia with routine healing, subsequent encounter Yes    PLAN Call if any problems.  Precautions discussed.  Continue current medications.   Return to clinic after MRI of the left knee   Stay out of work.  Electronically Signed Darreld McleanWayne Magdiel Bartles, MD 1/22/20199:52 AM

## 2017-05-28 ENCOUNTER — Other Ambulatory Visit: Payer: Self-pay | Admitting: Orthopaedic Surgery

## 2017-05-28 ENCOUNTER — Ambulatory Visit (HOSPITAL_COMMUNITY)
Admission: RE | Admit: 2017-05-28 | Discharge: 2017-05-28 | Disposition: A | Discharge status: Home / Self Care | Payer: 59 | Source: Ambulatory Visit | Attending: Orthopaedic Surgery | Admitting: Orthopaedic Surgery

## 2017-05-28 DIAGNOSIS — S82115D Nondisplaced fracture of left tibial spine, subsequent encounter for closed fracture with routine healing: Secondary | ICD-10-CM | POA: Diagnosis present

## 2017-05-28 DIAGNOSIS — M25461 Effusion, right knee: Secondary | ICD-10-CM | POA: Diagnosis not present

## 2017-05-28 DIAGNOSIS — X58XXXD Exposure to other specified factors, subsequent encounter: Secondary | ICD-10-CM | POA: Insufficient documentation

## 2017-05-28 DIAGNOSIS — R609 Edema, unspecified: Secondary | ICD-10-CM | POA: Diagnosis not present

## 2017-05-28 DIAGNOSIS — M795 Residual foreign body in soft tissue: Secondary | ICD-10-CM

## 2017-05-28 DIAGNOSIS — M25462 Effusion, left knee: Secondary | ICD-10-CM | POA: Insufficient documentation

## 2017-06-01 ENCOUNTER — Ambulatory Visit: Payer: 59 | Admitting: Orthopaedic Surgery

## 2017-06-02 ENCOUNTER — Ambulatory Visit: Payer: 59 | Admitting: Orthopaedic Surgery

## 2017-06-03 ENCOUNTER — Encounter: Payer: Self-pay | Admitting: Orthopaedic Surgery

## 2017-06-03 ENCOUNTER — Ambulatory Visit (INDEPENDENT_AMBULATORY_CARE_PROVIDER_SITE_OTHER): Payer: 59 | Admitting: Orthopaedic Surgery

## 2017-06-03 ENCOUNTER — Telehealth: Payer: Self-pay | Admitting: Orthopaedic Surgery

## 2017-06-03 VITALS — BP 157/103 | HR 78 | Temp 97.7°F | Ht 69.0 in | Wt 215.0 lb

## 2017-06-03 DIAGNOSIS — S82115D Nondisplaced fracture of left tibial spine, subsequent encounter for closed fracture with routine healing: Secondary | ICD-10-CM | POA: Diagnosis not present

## 2017-06-03 NOTE — Progress Notes (Signed)
Patient Stephen Blanchard, male DOB:11-18-1960, 57 y.o. HQI:[id number]  Chief Complaint  Patient presents with  . Knee Pain    left   . Results    review MRI     HPI  Stephen Blanchard is a 57 y.o. male who has left knee pain and IMPRESSION: 1. Bone marrow edema of the proximal tibia involving the lateral tibial epiphysis and metaphysis associated with a non displaced, nondepressed tibial epiphyseal fracture. Associated small to moderate joint effusion. 2. Thickened LCL complex with adjacent edema compatible with a grade 1 sprain. 3. No meniscal tear. 4. Small posterior intra-articular loose bodies within small outpouching off the knee joint posterolaterally. fracture.  He had MRI of the knee showing:  I have explained the findings to him.  He needs to stay off his knee and continue his crutches.  He has less pain today.   HPI  Body mass index is 31.75 kg/m.  ROS  Review of Systems  Musculoskeletal: Positive for arthralgias, gait problem and joint swelling.  All other systems reviewed and are negative.   Past Medical History:  Diagnosis Date  . Hypertension   . Pneumonia   . Syncope     History reviewed. No pertinent surgical history.  History reviewed. No pertinent family history.  Social History Social History   Tobacco Use  . Smoking status: Former Smoker    Packs/day: 2.00    Types: Cigarettes    Last attempt to quit: 02/03/2011    Years since quitting: 6.3  . Smokeless tobacco: Never Used  Substance Use Topics  . Alcohol use: No  . Drug use: No    No Known Allergies  Current Outpatient Medications  Medication Sig Dispense Refill  . carvedilol (COREG) 12.5 MG tablet     . esomeprazole (NEXIUM) 20 MG capsule Take 20 mg by mouth every morning.    Marland Kitchen tetrahydrozoline (EYE DROPS) 0.05 % ophthalmic solution Place 1 drop into both eyes daily as needed. For red eye    . HYDROcodone-acetaminophen (NORCO/VICODIN) 5-325 MG tablet Take one tab po q 4 hrs  prn pain (Patient not taking: Reported on 06/03/2017) 12 tablet 0   No current facility-administered medications for this visit.      Physical Exam  Blood pressure (!) 157/103, pulse 78, temperature 97.7 F (36.5 C), height 5\' 9"  (1.753 m), weight 215 lb (97.5 kg).  Constitutional: overall normal hygiene, normal nutrition, well developed, normal grooming, normal body habitus. Assistive device:crutches  Musculoskeletal: gait and station Limp left, muscle tone and strength are normal, no tremors or atrophy is present.  .  Neurological: coordination overall normal.  Deep tendon reflex/nerve stretch intact.  Sensation normal.  Cranial nerves II-XII intact.   Skin:   Normal overall no scars, lesions, ulcers or rashes. No psoriasis.  Psychiatric: Alert and oriented x 3.  Recent memory intact, remote memory unclear.  Normal mood and affect. Well groomed.  Good eye contact.  Cardiovascular: overall no swelling, no varicosities, no edema bilaterally, normal temperatures of the legs and arms, no clubbing, cyanosis and good capillary refill.  Lymphatic: palpation is normal.  Left knee is tender.  He has less effusion today. ROM is full.  NV intact.  All other systems reviewed and are negative   The patient has been educated about the nature of the problem(s) and counseled on treatment options.  The patient appeared to understand what I have discussed and is in agreement with it.  Encounter Diagnosis  Name Primary?  Marland Kitchen  Closed nondisplaced fracture of spine of left tibia with routine healing, subsequent encounter Yes    PLAN Call if any problems.  Precautions discussed.  Continue current medications.   Return to clinic 3 weeks   X-rays on return.  Electronically Signed Darreld McleanWayne Borna Wessinger, MD 1/31/201911:12 AM

## 2017-06-03 NOTE — Telephone Encounter (Signed)
06/03/17 Fax received, and also, patient presented with, information referring to possible Workers comp, regarding injury for which patient has been treating at our clinic (Date of injury 05/13/17), per Emergency room treatment 05/14/17, and initial visit at our clinic 05/18/17.  Employer's Workers comp contact information received: [employer], ph# [phone] fax# [phone], attention Claretta FraiseSusan Cotnoir, at address  [address], [place], [place] [id number] Claim# [id number]   Fax sent in response, our in-house Workers comp set up form; awaiting return of form, and any further information as to whether patient is approved to continue care at our clinic.  Patient aware of status, and that the insurance payer information he presented initially, and at each visit, has already been filed through and including today's office visit, 06/03/17, and will need to be addressed accordingly upon further approval.

## 2017-06-08 ENCOUNTER — Telehealth: Payer: Self-pay | Admitting: Orthopaedic Surgery

## 2017-06-08 NOTE — Telephone Encounter (Signed)
Patient's form for disability insurer for loan faxed to requester, [employer], fax# [phone]; signed authorization on file.  Going forward, patient made aware of forms process through our contracted provider, [facility].

## 2017-06-24 ENCOUNTER — Ambulatory Visit (INDEPENDENT_AMBULATORY_CARE_PROVIDER_SITE_OTHER): Payer: 59 | Admitting: Orthopaedic Surgery

## 2017-06-24 ENCOUNTER — Ambulatory Visit (INDEPENDENT_AMBULATORY_CARE_PROVIDER_SITE_OTHER): Payer: 59

## 2017-06-24 ENCOUNTER — Encounter: Payer: Self-pay | Admitting: Orthopaedic Surgery

## 2017-06-24 DIAGNOSIS — S82115D Nondisplaced fracture of left tibial spine, subsequent encounter for closed fracture with routine healing: Secondary | ICD-10-CM | POA: Diagnosis not present

## 2017-06-24 NOTE — Progress Notes (Signed)
CC:  My knee does not hurt  He is doing well with the left knee.  He has no pain, no swelling.  He is using his crutches.  NV intact.  He has no effusion and near full ROM.  X-rays were done of the left knee, reported separately.  Encounter Diagnosis  Name Primary?  . Closed nondisplaced fracture of spine of left tibia with routine healing, subsequent encounter Yes   He is to continue the crutches and no weight bearing.  Return in three weeks.  X-rays on return.  Call if any problem.  Precautions discussed.   Electronically Signed Darreld McleanWayne Govanni Plemons, MD 2/21/20199:28 AM

## 2017-06-25 ENCOUNTER — Telehealth: Payer: Self-pay | Admitting: Orthopedic Surgery

## 2017-06-25 NOTE — Telephone Encounter (Signed)
Patient has questions regarding weight-bearing, per yesterday's office visit notes, 06/24/17.  Please advise. Ph# [phone]

## 2017-06-25 NOTE — Telephone Encounter (Signed)
He is to continue the crutches and no weight bearing. I called him to advise.

## 2017-07-15 DIAGNOSIS — S82116D Nondisplaced fracture of unspecified tibial spine, subsequent encounter for closed fracture with routine healing: Secondary | ICD-10-CM | POA: Insufficient documentation

## 2017-07-16 ENCOUNTER — Encounter: Payer: Self-pay | Admitting: Orthopedic Surgery

## 2017-07-16 ENCOUNTER — Ambulatory Visit (INDEPENDENT_AMBULATORY_CARE_PROVIDER_SITE_OTHER): Payer: 59 | Admitting: Orthopedic Surgery

## 2017-07-16 ENCOUNTER — Ambulatory Visit (INDEPENDENT_AMBULATORY_CARE_PROVIDER_SITE_OTHER): Payer: 59

## 2017-07-16 VITALS — BP 154/96 | HR 86 | Ht 69.0 in | Wt 219.0 lb

## 2017-07-16 DIAGNOSIS — S82115D Nondisplaced fracture of left tibial spine, subsequent encounter for closed fracture with routine healing: Secondary | ICD-10-CM | POA: Diagnosis not present

## 2017-07-16 NOTE — Progress Notes (Signed)
Chief Complaint  Patient presents with  . Knee Pain    left   Last note is included below  Patient is 63 days past injury which was January 11  Had a tibial spine fracture  X-ray today shows fracture healing  Clinical exam shows regained full range of motion no swelling no tenderness or effusion trace positive anterior drawer test at 90 degrees no collateral ligament instability  Patient is placed in a economy hinged brace and after walking and it  He says it feels good and he would like to return to work  He can wear the brace as needed will call us if it does not pan out for him otherwise follow-up as needed.  Last note as indicated below CC:  My knee does not hurt   He is doing well with the left knee.  He has no pain, no swelling.  He is using his crutches.   NV intact.  He has no effusion and near full ROM.   X-rays were done of the left knee, reported separately.       Encounter Diagnosis  Name Primary?  . Closed nondisplaced fracture of spine of left tibia with routine healing, subsequent encounter Yes    He is to continue the crutches and no weight bearing.   Return in three weeks.   X-rays on return.   Call if any problem.

## 2017-07-16 NOTE — Patient Instructions (Signed)
Return to work Monday, March 18  Wear brace as needed  Call us if any problems  No follow-up scheduled

## 2017-10-12 ENCOUNTER — Telehealth: Payer: Self-pay | Admitting: Orthopaedic Surgery

## 2017-10-12 NOTE — Telephone Encounter (Signed)
Called patient regarding request received from Workers comp - request for Form 25R. Per Dr Sanjuan DameKeeling's review, patient did not return; therefore, would need to be scheduled for appointment.  Called patient, left message.

## 2017-10-13 NOTE — Telephone Encounter (Signed)
Patient returned call. Appointment offered and scheduled. Aware.

## 2017-10-19 NOTE — Telephone Encounter (Signed)
Patient called and canceled his appointment for 10/20/17 @ 2:40 pm. I offered to reschedule the appointment and he said " I don't need to."

## 2017-10-20 ENCOUNTER — Ambulatory Visit: Payer: Self-pay | Admitting: Orthopaedic Surgery

## 2017-12-25 ENCOUNTER — Emergency Department (HOSPITAL_COMMUNITY)
Admission: EM | Admit: 2017-12-25 | Discharge: 2017-12-25 | Disposition: A | Discharge status: Home / Self Care | Payer: 59 | Attending: Emergency Medicine | Admitting: Emergency Medicine

## 2017-12-25 ENCOUNTER — Other Ambulatory Visit: Payer: Self-pay

## 2017-12-25 ENCOUNTER — Encounter (HOSPITAL_COMMUNITY): Payer: Self-pay | Admitting: Emergency Medicine

## 2017-12-25 DIAGNOSIS — S83401A Sprain of unspecified collateral ligament of right knee, initial encounter: Secondary | ICD-10-CM

## 2017-12-25 DIAGNOSIS — Y998 Other external cause status: Secondary | ICD-10-CM | POA: Diagnosis not present

## 2017-12-25 DIAGNOSIS — S8991XA Unspecified injury of right lower leg, initial encounter: Secondary | ICD-10-CM | POA: Diagnosis present

## 2017-12-25 DIAGNOSIS — Y939 Activity, unspecified: Secondary | ICD-10-CM | POA: Insufficient documentation

## 2017-12-25 DIAGNOSIS — S8391XA Sprain of unspecified site of right knee, initial encounter: Secondary | ICD-10-CM | POA: Insufficient documentation

## 2017-12-25 DIAGNOSIS — Y33XXXA Other specified events, undetermined intent, initial encounter: Secondary | ICD-10-CM | POA: Diagnosis not present

## 2017-12-25 DIAGNOSIS — I10 Essential (primary) hypertension: Secondary | ICD-10-CM | POA: Diagnosis not present

## 2017-12-25 DIAGNOSIS — Y929 Unspecified place or not applicable: Secondary | ICD-10-CM | POA: Insufficient documentation

## 2017-12-25 DIAGNOSIS — Z79899 Other long term (current) drug therapy: Secondary | ICD-10-CM | POA: Insufficient documentation

## 2017-12-25 DIAGNOSIS — Z87891 Personal history of nicotine dependence: Secondary | ICD-10-CM | POA: Diagnosis not present

## 2017-12-25 HISTORY — DX: Displaced fracture of left tibial spine, initial encounter for closed fracture: S82.112A

## 2017-12-25 MED ORDER — NAPROXEN 375 MG PO TABS: 375.0000 mg | ORAL_TABLET | Freq: Two times a day (BID) | ORAL | 0 refills | Status: AC

## 2017-12-25 MED ORDER — NAPROXEN 250 MG PO TABS
375.0000 mg | ORAL_TABLET | Freq: Once | ORAL | Status: AC
  Administered 2017-12-25: 375 mg via ORAL
  Filled 2017-12-25: qty 2

## 2017-12-25 NOTE — ED Triage Notes (Signed)
Pt reports R knee pain started over a week ago with no known injury. Has been using a knee brace and Tylenol at home without improvement. Pain radiates from both sides of patella into thigh.

## 2017-12-25 NOTE — Discharge Instructions (Signed)
Continue to use your knee brace and use crutches to be weight bearing as tolerated. Ice and elevate to reduce swelling. Take Naproxen as prescribed. Follow up with Dr. Hilda LiasKeeling for further evaluation, and return here with any severe pain or swelling, fever or new concern.

## 2017-12-25 NOTE — ED Provider Notes (Signed)
[facility] EMERGENCY DEPARTMENT Provider Note   CSN: [id number] Arrival date & time: 12/25/17  1242     History   Chief Complaint Chief Complaint  Patient presents with  . Knee Pain    right    HPI Stephen Blanchard is a 57 y.o. male.  Patient presents for evaluation of right knee pain and swelling that started one week earlier without known injury or strain. He has been using a knee brace with limited relief, able to go about his usual activity, but reports the knee continues to be symptomatic without improvement. No numbness or weakness.   The history is provided by the patient and the spouse.    Past Medical History:  Diagnosis Date  . Fracture of left tibial spine 05/2017  . Hypertension   . Pneumonia   . Syncope     Patient Active Problem List   Diagnosis Date Noted  . Closed nondisplaced fracture of tibial spine with routine healing 05/14/17 07/15/2017  . HTN (hypertension) 10/23/2015  . CAP (community acquired pneumonia) 10/22/2015  . Elevated troponin 10/22/2015  . Pneumonia 10/22/2015  . Syncope 02/03/2012  . AKI (acute kidney injury) (HCC) 02/03/2012  . Hypercalcemia 02/03/2012    History reviewed. No pertinent surgical history.      Home Medications    Prior to Admission medications   Medication Sig Start Date End Date Taking? Authorizing Provider  carvedilol (COREG) 12.5 MG tablet 12.5 mg 2 (two) times daily with a meal.  05/03/17  Yes [provider]  clonazePAM (KLONOPIN) 0.5 MG tablet TK 1 T PO QHS 06/28/17  Yes [provider]  esomeprazole (NEXIUM) 20 MG capsule Take 20 mg by mouth every morning.   Yes [provider]  tetrahydrozoline (EYE DROPS) 0.05 % ophthalmic solution Place 1 drop into both eyes daily as needed. For red eye   Yes [provider]    Family History History reviewed. No pertinent family history.  Social History Social History   Tobacco Use  . Smoking status: Former Smoker   Packs/day: 2.00    Types: Cigarettes    Last attempt to quit: 02/03/2011    Years since quitting: 6.8  . Smokeless tobacco: Never Used  Substance Use Topics  . Alcohol use: No  . Drug use: No     Allergies   Patient has no known allergies.   Review of Systems Review of Systems  Musculoskeletal:       See HPI  Skin: Negative.  Negative for color change.  Neurological: Negative.  Negative for weakness and numbness.     Physical Exam Updated Vital Signs BP 137/87 (BP Location: Right Arm)   Pulse 76   Temp 97.9 F (36.6 C) (Oral)   Resp 14   Ht 5\' 9"  (1.753 m)   Wt 100.7 kg   SpO2 100%   BMI 32.78 kg/m   Physical Exam  Constitutional: He is oriented to person, place, and time. He appears well-developed and well-nourished.  Neck: Normal range of motion.  Pulmonary/Chest: Effort normal.  Musculoskeletal: Normal range of motion.  Right knee without discoloration or significant swelling. Joint is stable, no laxity. No pain with meniscal maneuvers. Calf and Thigh nontender. Minimal tenderness to medial and lateral knee. No effusion.   Neurological: He is alert and oriented to person, place, and time.  Skin: Skin is warm and dry.  Psychiatric: He has a normal mood and affect.     ED Treatments / Results  Labs (all  labs ordered are listed, but only abnormal results are displayed) Labs Reviewed - No data to display  EKG None  Radiology No results found.  Procedures Procedures (including critical care time)  Medications Ordered in ED Medications - No data to display   Initial Impression / Assessment and Plan / ED Course  I have reviewed the triage vital signs and the nursing notes.  Pertinent labs & imaging results that were available during my care of the patient were reviewed by me and considered in my medical decision making (see chart for details).     Patient with painful right knee x 1 week without known injury.   Knee exam is benign, joint stable.  Do no suspect ruptured ligament. No sign of intra-articular process.   Will refer to orthopedics for further management. He has seen Dr. Hilda Lias in the past. He has a brace and crutches and it is recommended he be nonweight bearing on the right until seen by orthopedics.   Final Clinical Impressions(s) / ED Diagnoses   Final diagnoses:  None   1. Right knee sprain  ED Discharge Orders    None       Danne Harbor 12/28/17 0203    Eber Hong, MD 12/28/17 1048

## 2021-10-20 ENCOUNTER — Encounter (HOSPITAL_COMMUNITY): Payer: Self-pay | Admitting: Emergency Medicine

## 2021-10-20 ENCOUNTER — Emergency Department (HOSPITAL_COMMUNITY)
Admission: EM | Admit: 2021-10-20 | Discharge: 2021-10-20 | Discharge status: Against Medical Advice | Payer: 59 | Attending: Emergency Medicine | Admitting: Emergency Medicine

## 2021-10-20 ENCOUNTER — Other Ambulatory Visit: Payer: Self-pay

## 2021-10-20 DIAGNOSIS — R42 Dizziness and giddiness: Secondary | ICD-10-CM | POA: Insufficient documentation

## 2021-10-20 DIAGNOSIS — Z5321 Procedure and treatment not carried out due to patient leaving prior to being seen by health care provider: Secondary | ICD-10-CM | POA: Insufficient documentation

## 2021-10-20 NOTE — ED Triage Notes (Signed)
Pt c/o not being able to hear out of left ear. Seen pcp for it but still cant hear out of  it. C/o dizziness when he woke up this morning at 5am.LKW 6/18/1130pm. Denies difficulty swallowing or speaking. Denies weakness on one side. Came from [facility]. States when he lies down the room spins. See [facility]. Pt is ambulatory with steady gait. A/o.

## 2022-05-08 ENCOUNTER — Encounter (HOSPITAL_COMMUNITY): Payer: Self-pay

## 2022-05-08 ENCOUNTER — Emergency Department (HOSPITAL_COMMUNITY)
Admission: EM | Admit: 2022-05-08 | Discharge: 2022-05-08 | Disposition: A | Discharge status: Home / Self Care | Payer: BC Managed Care – PPO | Attending: Emergency Medicine | Admitting: Emergency Medicine

## 2022-05-08 ENCOUNTER — Emergency Department (HOSPITAL_COMMUNITY): Payer: BC Managed Care – PPO

## 2022-05-08 ENCOUNTER — Other Ambulatory Visit: Payer: Self-pay

## 2022-05-08 DIAGNOSIS — R509 Fever, unspecified: Secondary | ICD-10-CM | POA: Diagnosis present

## 2022-05-08 DIAGNOSIS — Z1152 Encounter for screening for COVID-19: Secondary | ICD-10-CM | POA: Diagnosis not present

## 2022-05-08 DIAGNOSIS — J101 Influenza due to other identified influenza virus with other respiratory manifestations: Secondary | ICD-10-CM | POA: Diagnosis not present

## 2022-05-08 DIAGNOSIS — J069 Acute upper respiratory infection, unspecified: Secondary | ICD-10-CM

## 2022-05-08 DIAGNOSIS — J111 Influenza due to unidentified influenza virus with other respiratory manifestations: Secondary | ICD-10-CM

## 2022-05-08 LAB — RESP PANEL BY RT-PCR (RSV, FLU A&B, COVID)  RVPGX2
Influenza A by PCR: POSITIVE — AB
Influenza B by PCR: NEGATIVE
Resp Syncytial Virus by PCR: NEGATIVE
SARS Coronavirus 2 by RT PCR: NEGATIVE

## 2022-05-08 LAB — BASIC METABOLIC PANEL
Anion gap: 10 (ref 5–15)
BUN: 20 mg/dL (ref 6–20)
CO2: 25 mmol/L (ref 22–32)
Calcium: 8.6 mg/dL — ABNORMAL LOW (ref 8.9–10.3)
Chloride: 97 mmol/L — ABNORMAL LOW (ref 98–111)
Creatinine, Ser: 1.27 mg/dL — ABNORMAL HIGH (ref 0.61–1.24)
GFR, Estimated: >60 mL/min (ref >60)
Glucose, Bld: 148 mg/dL — ABNORMAL HIGH (ref 70–99)
Potassium: 3.7 mmol/L (ref 3.5–5.1)
Sodium: 132 mmol/L — ABNORMAL LOW (ref 135–145)

## 2022-05-08 LAB — CBC
HCT: 42.6 % (ref 39.0–52.0)
Hemoglobin: 13.5 g/dL (ref 13.0–17.0)
MCH: 27.2 pg (ref 26.0–34.0)
MCHC: 31.7 g/dL (ref 30.0–36.0)
MCV: 85.7 fL (ref 80.0–100.0)
Platelets: 87 10*3/uL — ABNORMAL LOW (ref 150–400)
RBC: 4.97 MIL/uL (ref 4.22–5.81)
RDW: 14.6 % (ref 11.5–15.5)
WBC: 5.3 10*3/uL (ref 4.0–10.5)
nRBC: 0 % (ref 0.0–0.2)

## 2022-05-08 MED ORDER — IBUPROFEN 400 MG PO TABS
400.0000 mg | ORAL_TABLET | Freq: Once | ORAL | Status: AC
  Administered 2022-05-08: 400 mg via ORAL
  Filled 2022-05-08: qty 1

## 2022-05-08 MED ORDER — ALBUTEROL SULFATE HFA 108 (90 BASE) MCG/ACT IN AERS: 2.0000 | INHALATION_SPRAY | RESPIRATORY_TRACT | 1 refills | Status: AC | PRN

## 2022-05-08 MED ORDER — ACETAMINOPHEN 500 MG PO TABS
1000.0000 mg | ORAL_TABLET | Freq: Once | ORAL | Status: AC
  Administered 2022-05-08: 1000 mg via ORAL
  Filled 2022-05-08: qty 2

## 2022-05-08 MED ORDER — ALBUTEROL SULFATE (2.5 MG/3ML) 0.083% IN NEBU
5.0000 mg | INHALATION_SOLUTION | Freq: Once | RESPIRATORY_TRACT | Status: AC
  Administered 2022-05-08: 5 mg via RESPIRATORY_TRACT
  Filled 2022-05-08: qty 6

## 2022-05-08 MED ORDER — GUAIFENESIN-DM 100-10 MG/5ML PO SYRP
15.0000 mL | ORAL_SOLUTION | Freq: Once | ORAL | Status: AC
  Administered 2022-05-08: 15 mL via ORAL
  Filled 2022-05-08: qty 15

## 2022-05-08 NOTE — ED Notes (Signed)
Pt and spouse requesting something for cough. No coughing noted by this RN. MD made aware.

## 2022-05-08 NOTE — ED Triage Notes (Signed)
Pt presents with fever, cough, weakness, body aches, no appetite, nausea for 3 days.

## 2022-05-08 NOTE — Discharge Instructions (Addendum)
It was our pleasure to provide your ER care today - we hope that you feel better.  Your influenza test is positive.   Drink plenty of fluids/stay well hydrated.  You may try myquil, mucinex, robitussin or similar over the counter cold and flu medication for symptom relief.  You may use albuterol inhaler as need if wheezing.   Follow up with primary care doctor in two weeks if symptoms fail to improve/resolve.  Return to ER if worse, new symptoms, increased trouble breathing, or other emergency concern.

## 2022-05-08 NOTE — ED Provider Notes (Signed)
[facility] EMERGENCY DEPARTMENT Provider Note   CSN: [id number] Arrival date & time: 05/08/22  9562     History  Chief Complaint  Patient presents with   Fever   Cough    Stephen Blanchard is a 62 y.o. male.  Pt with cough, congestion, body aches. +subjective fever. Symptoms acute onset in past two days, moderate, persistent. No specific known ill contacts. No sore throat or trouble swallowing. No hx chronic lung disease. Non smoker. No chest pain. No leg pain or swelling. No abd pain or nvd.   The history is provided by the patient and medical records.  Fever Associated symptoms: congestion, cough and myalgias   Associated symptoms: no chest pain, no confusion, no diarrhea, no headaches, no rash and no sore throat   Cough Associated symptoms: fever and myalgias   Associated symptoms: no chest pain, no headaches, no rash, no shortness of breath and no sore throat        Home Medications Prior to Admission medications   Medication Sig Start Date End Date Taking? Authorizing Provider  carvedilol (COREG) 12.5 MG tablet 12.5 mg 2 (two) times daily with a meal.  05/03/17   [provider]  clonazePAM (KLONOPIN) 0.5 MG tablet TK 1 T PO QHS 06/28/17   [provider]  esomeprazole (NEXIUM) 20 MG capsule Take 20 mg by mouth every morning.    [provider]  naproxen (NAPROSYN) 375 MG tablet Take 1 tablet (375 mg total) by mouth 2 (two) times daily. 12/25/17   Charlann Lange, PA-C  tetrahydrozoline (EYE DROPS) 0.05 % ophthalmic solution Place 1 drop into both eyes daily as needed. For red eye    [provider]      Allergies    Patient has no known allergies.    Review of Systems   Review of Systems  Constitutional:  Positive for fever.  HENT:  Positive for congestion. Negative for sore throat.   Eyes:  Negative for redness.  Respiratory:  Positive for cough. Negative for shortness of breath.   Cardiovascular:  Negative for chest pain and leg  swelling.  Gastrointestinal:  Negative for abdominal pain and diarrhea.  Genitourinary:  Negative for flank pain.  Musculoskeletal:  Positive for myalgias. Negative for neck pain and neck stiffness.  Skin:  Negative for rash.  Neurological:  Negative for headaches.  Hematological:  Does not bruise/bleed easily.  Psychiatric/Behavioral:  Negative for confusion.     Physical Exam Updated Vital Signs Pulse (!) 132   Temp 98.4 F (36.9 C)   Resp 19   Ht 1.753 m (5\' 9" )   Wt 102.1 kg   SpO2 99%   BMI 33.23 kg/m  Physical Exam Vitals and nursing note reviewed.  Constitutional:      Appearance: Normal appearance. He is well-developed.  HENT:     Head: Atraumatic.     Nose: Nose normal.     Mouth/Throat:     Mouth: Mucous membranes are moist.     Pharynx: Oropharynx is clear.  Eyes:     General: No scleral icterus.    Conjunctiva/sclera: Conjunctivae normal.     Pupils: Pupils are equal, round, and reactive to light.  Neck:     Trachea: No tracheal deviation.     Comments: No stiffness or rigidity.  Cardiovascular:     Rate and Rhythm: Regular rhythm. Tachycardia present.     Pulses: Normal pulses.     Heart sounds: Normal heart sounds. No murmur heard.  No friction rub. No gallop.  Pulmonary:     Effort: Pulmonary effort is normal. No accessory muscle usage or respiratory distress.     Breath sounds: Wheezing present.     Comments: coughing Abdominal:     General: Bowel sounds are normal. There is no distension.     Palpations: Abdomen is soft.     Tenderness: There is no abdominal tenderness.  Genitourinary:    Comments: No cva tenderness. Musculoskeletal:        General: No swelling or tenderness.     Cervical back: Normal range of motion and neck supple. No rigidity.     Right lower leg: No edema.     Left lower leg: No edema.  Skin:    General: Skin is warm and dry.     Findings: No rash.  Neurological:     Mental Status: He is alert.     Comments: Alert,  speech clear. Motor/sens grossly intact. Steady gait.   Psychiatric:        Mood and Affect: Mood normal.     ED Results / Procedures / Treatments   Labs (all labs ordered are listed, but only abnormal results are displayed) Results for orders placed or performed during the hospital encounter of 05/08/22  Resp panel by RT-PCR (RSV, Flu A&B, Covid) Anterior Nasal Swab   Specimen: Anterior Nasal Swab  Result Value Ref Range   SARS Coronavirus 2 by RT PCR NEGATIVE NEGATIVE   Influenza A by PCR POSITIVE (A) NEGATIVE   Influenza B by PCR NEGATIVE NEGATIVE   Resp Syncytial Virus by PCR NEGATIVE NEGATIVE  Basic metabolic panel  Result Value Ref Range   Sodium 132 (L) 135 - 145 mmol/L   Potassium 3.7 3.5 - 5.1 mmol/L   Chloride 97 (L) 98 - 111 mmol/L   CO2 25 22 - 32 mmol/L   Glucose, Bld 148 (H) 70 - 99 mg/dL   BUN 20 6 - 20 mg/dL   Creatinine, Ser 1.27 (H) 0.61 - 1.24 mg/dL   Calcium 8.6 (L) 8.9 - 10.3 mg/dL   GFR, Estimated >60 >60 mL/min   Anion gap 10 5 - 15  CBC  Result Value Ref Range   WBC 5.3 4.0 - 10.5 K/uL   RBC 4.97 4.22 - 5.81 MIL/uL   Hemoglobin 13.5 13.0 - 17.0 g/dL   HCT 42.6 39.0 - 52.0 %   MCV 85.7 80.0 - 100.0 fL   MCH 27.2 26.0 - 34.0 pg   MCHC 31.7 30.0 - 36.0 g/dL   RDW 14.6 11.5 - 15.5 %   Platelets 87 (L) 150 - 400 K/uL   nRBC 0.0 0.0 - 0.2 %   DG Chest Port 1 View  Result Date: 05/08/2022 CLINICAL DATA:  Fever and cough EXAM: PORTABLE CHEST 1 VIEW COMPARISON:  10/22/2015 FINDINGS: Cardiomegaly and vascular pedicle widening accentuated by low volumes. There is no edema, consolidation, effusion, or pneumothorax. IMPRESSION: Low volume chest without acute finding.  No visible pneumonia. Electronically Signed   By: Jorje Guild M.D.   On: 05/08/2022 07:26    EKG EKG Interpretation  Date/Time:  Friday May 08 2022 08:34:39 EST Ventricular Rate:  137 PR Interval:  114 QRS Duration: 91 QT Interval:  290 QTC Calculation: 438 R Axis:   58 Text  Interpretation: Sinus tachycardia Confirmed by Lajean Saver [id number]) on 05/08/2022 8:45:54 AM  Radiology DG Chest Port 1 View  Result Date: 05/08/2022 CLINICAL DATA:  Fever and cough EXAM: PORTABLE CHEST  1 VIEW COMPARISON:  10/22/2015 FINDINGS: Cardiomegaly and vascular pedicle widening accentuated by low volumes. There is no edema, consolidation, effusion, or pneumothorax. IMPRESSION: Low volume chest without acute finding.  No visible pneumonia. Electronically Signed   By: Tiburcio Pea M.D.   On: 05/08/2022 07:26    Procedures Procedures    Medications Ordered in ED Medications  albuterol (PROVENTIL) (2.5 MG/3ML) 0.083% nebulizer solution 5 mg (has no administration in time range)  acetaminophen (TYLENOL) tablet 1,000 mg (has no administration in time range)    ED Course/ Medical Decision Making/ A&P                           Medical Decision Making Problems Addressed: Influenza: acute illness or injury with systemic symptoms that poses a threat to life or bodily functions Viral URI with cough: acute illness or injury with systemic symptoms  Amount and/or Complexity of Data Reviewed Independent Historian:     Details: Family, hx External Data Reviewed: notes. Labs: ordered. Decision-making details documented in ED Course. Radiology: ordered and independent interpretation performed. Decision-making details documented in ED Course. ECG/medicine tests: ordered and independent interpretation performed. Decision-making details documented in ED Course.  Risk OTC drugs. Prescription drug management. Decision regarding hospitalization.   Iv ns. Continuous pulse ox and cardiac monitoring. Labs ordered/sent. Imaging ordered.   Diff dx includes pna, bronchitis, covid, flu, etc - dispo decision including potential need for admission if pna considered - will get labs and imaging and reassess.   Reviewed nursing notes and prior charts for additional history. External reports reviewed.     Cardiac monitor: sinus rhythm, rate 110. Albuterol tx re mild wheezing. Acetaminophen po. Albuterol neb tx. Post alb tx hr 130, sinus. No chest pain.   Labs reviewed/interpreted by me - hgb normal. K normal.   Xrays reviewed/interpreted by me - no pna.   Po fluids.   Recheck, no wheezing or increased wob. No chest pain. No nv. Hr currently 105, sinus.  Additional labs reviewed/interpreted by me - flu positive.   Pt currently appears stable for d/c.   Return precautions provided.             Final Clinical Impression(s) / ED Diagnoses Final diagnoses:  None    Rx / DC Orders ED Discharge Orders     None         Cathren Laine, MD 05/08/22 1113

## 2022-05-08 NOTE — ED Notes (Signed)
MD made aware of HR 130s.

## 2022-05-08 NOTE — ED Notes (Signed)
Called lab regarding delay in COVID result, lab looking into delay.
